# Patient Record
Sex: Male | Born: 1935 | Race: White | Hispanic: No | Smoking: Never smoker
Health system: Southern US, Community
[De-identification: ages and names within clinical notes are randomized; demographics above are authoritative.]

## PROBLEM LIST (undated history)

## (undated) DIAGNOSIS — U071 COVID-19: Secondary | ICD-10-CM

## (undated) DIAGNOSIS — F0280 Dementia in other diseases classified elsewhere without behavioral disturbance: Secondary | ICD-10-CM

## (undated) DIAGNOSIS — K219 Gastro-esophageal reflux disease without esophagitis: Secondary | ICD-10-CM

## (undated) DIAGNOSIS — E785 Hyperlipidemia, unspecified: Secondary | ICD-10-CM

## (undated) DIAGNOSIS — E871 Hypo-osmolality and hyponatremia: Secondary | ICD-10-CM

## (undated) DIAGNOSIS — I519 Heart disease, unspecified: Secondary | ICD-10-CM

## (undated) DIAGNOSIS — F419 Anxiety disorder, unspecified: Secondary | ICD-10-CM

## (undated) HISTORY — PX: CARDIAC SURGERY: SHX584

## (undated) HISTORY — PX: GANGLION CYST EXCISION: SHX1691

---

## 2016-07-21 ENCOUNTER — Emergency Department (INDEPENDENT_AMBULATORY_CARE_PROVIDER_SITE_OTHER): Payer: Medicare Other

## 2016-07-21 ENCOUNTER — Emergency Department
Admission: EM | Admit: 2016-07-21 | Discharge: 2016-07-21 | Disposition: A | Discharge status: Home / Self Care | Payer: Medicare Other | Source: Home / Self Care | Attending: Family Medicine | Admitting: Family Medicine

## 2016-07-21 ENCOUNTER — Encounter: Payer: Self-pay | Admitting: Emergency Medicine

## 2016-07-21 DIAGNOSIS — J069 Acute upper respiratory infection, unspecified: Secondary | ICD-10-CM | POA: Diagnosis not present

## 2016-07-21 DIAGNOSIS — B9789 Other viral agents as the cause of diseases classified elsewhere: Secondary | ICD-10-CM | POA: Diagnosis not present

## 2016-07-21 DIAGNOSIS — R062 Wheezing: Secondary | ICD-10-CM | POA: Diagnosis not present

## 2016-07-21 DIAGNOSIS — M545 Low back pain, unspecified: Secondary | ICD-10-CM

## 2016-07-21 DIAGNOSIS — R05 Cough: Secondary | ICD-10-CM | POA: Diagnosis not present

## 2016-07-21 DIAGNOSIS — Z20828 Contact with and (suspected) exposure to other viral communicable diseases: Secondary | ICD-10-CM

## 2016-07-21 LAB — POCT URINALYSIS DIP (MANUAL ENTRY)
Bilirubin, UA: NEGATIVE
Glucose, UA: NEGATIVE
Ketones, POC UA: NEGATIVE
Leukocytes, UA: NEGATIVE
Nitrite, UA: NEGATIVE
Protein Ur, POC: NEGATIVE
Spec Grav, UA: 1.01 (ref 1.005–1.03)
Urobilinogen, UA: 0.2 (ref 0–1)
pH, UA: 5.5 (ref 5–8)

## 2016-07-21 LAB — POCT INFLUENZA A/B
Influenza A, POC: NEGATIVE
Influenza B, POC: NEGATIVE

## 2016-07-21 MED ORDER — OSELTAMIVIR PHOSPHATE 75 MG PO CAPS: 75.0000 mg | ORAL_CAPSULE | Freq: Every day | ORAL | 0 refills | Status: DC

## 2016-07-21 MED ORDER — OSELTAMIVIR PHOSPHATE 75 MG PO CAPS: 75.0000 mg | ORAL_CAPSULE | Freq: Two times a day (BID) | ORAL | 0 refills | Status: DC

## 2016-07-21 NOTE — ED Provider Notes (Signed)
CSN: [id number]     Arrival date & time 07/21/16  1306 History   First MD Initiated Contact with Patient 07/21/16 1335     Chief Complaint  Patient presents with  . Cough   (Consider location/radiation/quality/duration/timing/severity/associated sxs/prior Treatment) HPI Allen Richardson is a 82 y.o. male presenting to UC with c/o 4 days of cough, congestion, mild body aches and fatigue. He did have the flu vaccine this year but notes his daughter, who he lives with, has had the flu recently.  Denies known fever. Denies n/v/d. He also reports having Right lower back pain and increased straining with urination. Denies hx of kidney stones.    History reviewed. No pertinent past medical history. History reviewed. No pertinent surgical history. No family history on file. Social History  Substance Use Topics  . Smoking status: Never Smoker  . Smokeless tobacco: Never Used  . Alcohol use No    Review of Systems  Constitutional: Positive for fatigue. Negative for chills and fever.  HENT: Positive for congestion and rhinorrhea. Negative for ear pain, sore throat, trouble swallowing and voice change.   Respiratory: Positive for cough. Negative for shortness of breath.   Cardiovascular: Negative for chest pain and palpitations.  Gastrointestinal: Negative for abdominal pain, diarrhea, nausea and vomiting.  Genitourinary: Positive for decreased urine volume, difficulty urinating and dysuria. Negative for frequency, hematuria and urgency.  Musculoskeletal: Positive for back pain ( Right lower). Negative for arthralgias and myalgias.  Skin: Negative for rash.    Allergies  Patient has no known allergies.  Home Medications   Prior to Admission medications   Medication Sig Start Date End Date Taking? Authorizing Provider  simvastatin (ZOCOR) 10 MG tablet Take 10 mg by mouth daily.   Yes Historical Provider, MD  tamsulosin (FLOMAX) 0.4 MG CAPS capsule Take 0.4 mg by mouth.   Yes Historical  Provider, MD  oseltamivir (TAMIFLU) 75 MG capsule Take 1 capsule (75 mg total) by mouth daily. For 10 days 07/21/16   Noland Fordyce, PA-C   Meds Ordered and Administered this Visit  Medications - No data to display  BP 148/74 (BP Location: Left Arm)   Pulse 70   Temp 98.4 F (36.9 C) (Oral)   Ht 5\' 9"  (1.753 m)   Wt 186 lb (84.4 kg)   SpO2 96%   BMI 27.47 kg/m  No data found.   Physical Exam  Constitutional: He is oriented to person, place, and time. He appears well-developed and well-nourished. No distress.  HENT:  Head: Normocephalic and atraumatic.  Right Ear: Tympanic membrane normal.  Left Ear: Tympanic membrane normal.  Nose: Nose normal.  Mouth/Throat: Uvula is midline, oropharynx is clear and moist and mucous membranes are normal.  Eyes: EOM are normal.  Neck: Normal range of motion. Neck supple.  Cardiovascular: Normal rate and regular rhythm.   Murmur ( chronic per pt) heard. Pulmonary/Chest: Effort normal. No stridor. No respiratory distress. He has wheezes ( upper lung fields). He has rales ( lower lung fields bilaterally ).  Musculoskeletal: Normal range of motion.  Lymphadenopathy:    He has no cervical adenopathy.  Neurological: He is alert and oriented to person, place, and time.  Skin: Skin is warm and dry. He is not diaphoretic.  Psychiatric: He has a normal mood and affect. His behavior is normal.  Nursing note and vitals reviewed.   Urgent Care Course     Procedures (including critical care time)  Labs Review Labs Reviewed  POCT URINALYSIS DIP (  MANUAL ENTRY) - Abnormal; Notable for the following:       Result Value   Blood, UA trace-intact (*)    All other components within normal limits  POCT INFLUENZA A/B    Imaging Review Dg Chest 2 View  Result Date: 07/21/2016 CLINICAL DATA:  Patient c/o cough, congestion, wheezing, just hasn't been feeling good since 07/17/16, no other complaints EXAM: CHEST  2 VIEW COMPARISON:  None FINDINGS: Midline  trachea. Prior median sternotomy. Normal heart size and mediastinal contours. No pleural effusion or pneumothorax. Left base scarring. IMPRESSION: No acute cardiopulmonary disease. Electronically Signed   By: Abigail Miyamoto M.D.   On: 07/21/2016 14:07      MDM   1. Viral URI with cough   2. Acute right-sided low back pain without sciatica   3. Exposure to the flu    Pt c/o URI symptoms for 4 days as well as Right lower back pain and straining with urination but notes he is on Flomax for this .  CXR: no evidence pneumonia. UA: unremarkable  Due to known exposure to his live-in daughter with flu, will start pt on prophylactic treatment  Rx: Tamiflu F/u with PCP next week for recheck of symptoms. Patient verbalized understanding and agreement with treatment plan.    Noland Fordyce, PA-C 07/21/16 1733

## 2016-07-21 NOTE — ED Triage Notes (Signed)
Cough, congestion x 4 days, daughter lives with him and has the flu.

## 2017-02-09 ENCOUNTER — Encounter: Payer: Self-pay | Admitting: *Deleted

## 2017-02-09 ENCOUNTER — Emergency Department
Admission: EM | Admit: 2017-02-09 | Discharge: 2017-02-09 | Disposition: A | Discharge status: Home / Self Care | Payer: Medicare Other | Source: Home / Self Care | Attending: Family Medicine | Admitting: Family Medicine

## 2017-02-09 DIAGNOSIS — L739 Follicular disorder, unspecified: Secondary | ICD-10-CM

## 2017-02-09 HISTORY — DX: Hyperlipidemia, unspecified: E78.5

## 2017-02-09 MED ORDER — DOXYCYCLINE HYCLATE 100 MG PO CAPS: 100.0000 mg | ORAL_CAPSULE | Freq: Two times a day (BID) | ORAL | 0 refills | Status: DC

## 2017-02-09 MED ORDER — PREDNISONE 20 MG PO TABS: ORAL_TABLET | ORAL | 0 refills | Status: DC

## 2017-02-09 NOTE — ED Triage Notes (Signed)
Patient c/o pruritic rash "all over" x 2 weeks. Also c/o burning to both feet. Used cortisone cream and alcohol to sites without relief.

## 2017-02-09 NOTE — Discharge Instructions (Signed)
May take Benadryl as needed for itching.

## 2017-02-09 NOTE — ED Provider Notes (Signed)
[facility]    CSN: [id number] Arrival date & time: 02/09/17  0854     History   Chief Complaint Chief Complaint  Patient presents with  . Rash    HPI Allen Richardson is a 82 y.o. male.   Patient complains of pruritic rash primarily on upper anterior legs, and some lesions on arms, lower legs, and trunk that started one week ago while he was working in his garden.  No involvement of face or scalp.  No lesions in mouth.  No fevers, chills, and sweats.  He feels well otherwise.   The history is provided by the patient.  Rash  Location:  Full body Quality: dryness, itchiness and redness   Quality: not blistering, not bruising, not burning, not draining, not painful, not peeling, not scaling, not swelling and not weeping   Severity:  Moderate Onset quality:  Sudden Duration:  1 week Timing:  Constant Progression:  Unchanged Chronicity:  New Context: not animal contact, not chemical exposure, not exposure to similar rash, not food, not hot tub use, not insect bite/sting, not medications, not new detergent/soap, not nuts, not plant contact, not pollen, not sick contacts and not sun exposure   Relieved by:  Nothing Worsened by:  Nothing Ineffective treatments:  Antihistamines and topical steroids Associated symptoms: no abdominal pain, no diarrhea, no fatigue, no fever, no headaches, no hoarse voice, no induration, no joint pain, no myalgias, no nausea, no shortness of breath, no sore throat, no throat swelling, no tongue swelling, no URI, not vomiting and not wheezing     Past Medical History:  Diagnosis Date  . Hyperlipidemia     There are no active problems to display for this patient.   History reviewed. No pertinent surgical history.     Home Medications    Prior to Admission medications   Medication Sig Start Date End Date Taking? Authorizing Provider  doxycycline (VIBRAMYCIN) 100 MG capsule Take 1 capsule (100 mg total) by mouth 2 (two) times daily.  Take with food. 02/09/17   Kandra Nicolas, MD  predniSONE (DELTASONE) 20 MG tablet Take one tab by mouth twice daily for 4 days, then one daily for 3 days. Take with food. 02/09/17   Kandra Nicolas, MD  simvastatin (ZOCOR) 10 MG tablet Take 10 mg by mouth daily.    [provider]  tamsulosin (FLOMAX) 0.4 MG CAPS capsule Take 0.4 mg by mouth.    [provider]    Family History History reviewed. No pertinent family history.  Social History Social History  Substance Use Topics  . Smoking status: Never Smoker  . Smokeless tobacco: Never Used  . Alcohol use No     Allergies   Penicillins   Review of Systems Review of Systems  Constitutional: Negative for fatigue and fever.  HENT: Negative for hoarse voice and sore throat.   Respiratory: Negative for shortness of breath and wheezing.   Gastrointestinal: Negative for abdominal pain, diarrhea, nausea and vomiting.  Musculoskeletal: Negative for arthralgias and myalgias.  Skin: Positive for rash.  Neurological: Negative for headaches.  All other systems reviewed and are negative.    Physical Exam Triage Vital Signs ED Triage Vitals  Enc Vitals Group     BP 02/09/17 0931 (!) 154/99     Pulse Rate 02/09/17 0931 70     Resp --      Temp 02/09/17 0931 97.7 F (36.5 C)     Temp Source 02/09/17 0931 Oral  SpO2 02/09/17 0931 96 %     Weight 02/09/17 0932 186 lb (84.4 kg)     Height --      Head Circumference --      Peak Flow --      Pain Score 02/09/17 0932 0     Pain Loc --      Pain Edu? --      Excl. in [place]? --    No data found.   Updated Vital Signs BP (!) 154/99 (BP Location: Left Arm)   Pulse 70   Temp 97.7 F (36.5 C) (Oral)   Wt 186 lb (84.4 kg)   SpO2 96%   BMI 27.47 kg/m   Visual Acuity Right Eye Distance:   Left Eye Distance:   Bilateral Distance:    Right Eye Near:   Left Eye Near:    Bilateral Near:     Physical Exam  Constitutional: He appears well-developed and  well-nourished. No distress.  HENT:  Head: Normocephalic.  Right Ear: External ear normal.  Left Ear: External ear normal.  Nose: Nose normal.  Mouth/Throat: Oropharynx is clear and moist.  Eyes: Pupils are equal, round, and reactive to light. Conjunctivae are normal.  Neck: Neck supple.  Cardiovascular: Normal heart sounds.   Pulmonary/Chest: Breath sounds normal.  Abdominal: There is no tenderness.  Musculoskeletal: He exhibits no edema.  Lymphadenopathy:    He has no cervical adenopathy.  Neurological: He is alert.  Skin: Skin is warm and dry.     Scattered tiny 21mm vesicles on erythematous base 37mm to 40mm diameter.  Lesions primarily concentrated on left anterior thigh.  No lesions on palms or plantar surfaces of feet.  Nursing note and vitals reviewed.    UC Treatments / Results  Labs (all labs ordered are listed, but only abnormal results are displayed) Labs Reviewed - No data to display  EKG  EKG Interpretation None       Radiology No results found.  Procedures Procedures (including critical care time)  Medications Ordered in UC Medications - No data to display   Initial Impression / Assessment and Plan / UC Course  I have reviewed the triage vital signs and the nursing notes.  Pertinent labs & imaging results that were available during my care of the patient were reviewed by me and considered in my medical decision making (see chart for details).    Begin doxycycline 100mg  BID for staph coverage, and prednisone burst/taper. May take Benadryl as needed for itching. Followup with dermatologist if not improved one week.    Final Clinical Impressions(s) / UC Diagnoses   Final diagnoses:  Folliculitis    New Prescriptions New Prescriptions   DOXYCYCLINE (VIBRAMYCIN) 100 MG CAPSULE    Take 1 capsule (100 mg total) by mouth 2 (two) times daily. Take with food.   PREDNISONE (DELTASONE) 20 MG TABLET    Take one tab by mouth twice daily for 4 days, then  one daily for 3 days. Take with food.        Kandra Nicolas, MD 02/09/17 1040

## 2017-03-02 ENCOUNTER — Other Ambulatory Visit: Payer: Self-pay | Admitting: Family Medicine

## 2017-03-02 ENCOUNTER — Emergency Department
Admission: EM | Admit: 2017-03-02 | Discharge: 2017-03-02 | Disposition: A | Discharge status: Home / Self Care | Payer: Medicare Other | Source: Home / Self Care | Attending: Family Medicine | Admitting: Family Medicine

## 2017-03-02 ENCOUNTER — Encounter: Payer: Self-pay | Admitting: Emergency Medicine

## 2017-03-02 DIAGNOSIS — R358 Other polyuria: Secondary | ICD-10-CM

## 2017-03-02 DIAGNOSIS — J029 Acute pharyngitis, unspecified: Secondary | ICD-10-CM

## 2017-03-02 DIAGNOSIS — R3589 Other polyuria: Secondary | ICD-10-CM

## 2017-03-02 LAB — POCT URINALYSIS DIP (MANUAL ENTRY)
BILIRUBIN UA: NEGATIVE
Blood, UA: NEGATIVE
Glucose, UA: NEGATIVE mg/dL
Ketones, POC UA: NEGATIVE mg/dL
LEUKOCYTES UA: NEGATIVE
NITRITE UA: NEGATIVE
PH UA: 6.5 (ref 5.0–8.0)
Protein Ur, POC: NEGATIVE mg/dL
Spec Grav, UA: 1.015 (ref 1.010–1.025)
Urobilinogen, UA: 1 E.U./dL

## 2017-03-02 LAB — POCT RAPID STREP A (OFFICE): Rapid Strep A Screen: NEGATIVE

## 2017-03-02 NOTE — ED Provider Notes (Signed)
[facility]    CSN: [id number] Arrival date & time: 03/02/17  1042     History   Chief Complaint Chief Complaint  Patient presents with  . Sore Throat  . Polyuria    HPI Allen Richardson is a 82 y.o. male.   HPI  Allen Richardson is a 82 y.o. male presenting to UC with c/o 3 days of mild sore throat and 2 days of bilateral lower back pain and polyuria.  He has had minimal congestion and chronic mild intermittent non-productive cough "all summer."  Denies fever, chills, n/v/d. No known sick contacts. Denies trying anything for his symptoms as he states the pain is minimal.  He has been on Flomax for about 20 years. He has routine f/u with his PCP next week.   Past Medical History:  Diagnosis Date  . Hyperlipidemia     There are no active problems to display for this patient.   History reviewed. No pertinent surgical history.     Home Medications    Prior to Admission medications   Medication Sig Start Date End Date Taking? Authorizing Provider  simvastatin (ZOCOR) 10 MG tablet Take 10 mg by mouth daily.    [provider]  tamsulosin (FLOMAX) 0.4 MG CAPS capsule Take 0.4 mg by mouth.    [provider]    Family History No family history on file.  Social History Social History  Substance Use Topics  . Smoking status: Never Smoker  . Smokeless tobacco: Never Used  . Alcohol use No     Allergies   Penicillins   Review of Systems Review of Systems  Constitutional: Negative for chills and fever.  HENT: Positive for sore throat. Negative for congestion, ear pain, trouble swallowing and voice change.   Respiratory: Negative for cough and shortness of breath.   Cardiovascular: Negative for chest pain and palpitations.  Gastrointestinal: Negative for abdominal pain, diarrhea, nausea and vomiting.  Endocrine: Positive for polyuria.  Genitourinary: Positive for frequency. Negative for dysuria, hematuria and urgency.    Musculoskeletal: Positive for back pain. Negative for arthralgias and myalgias.  Skin: Negative for rash.  Neurological: Negative for dizziness, light-headedness and headaches.     Physical Exam Triage Vital Signs ED Triage Vitals  Enc Vitals Group     BP 03/02/17 1112 (!) 151/80     Pulse Rate 03/02/17 1112 64     Resp --      Temp 03/02/17 1112 97.6 F (36.4 C)     Temp Source 03/02/17 1112 Oral     SpO2 03/02/17 1112 98 %     Weight 03/02/17 1113 182 lb (82.6 kg)     Height 03/02/17 1113 5\' 9"  (1.753 m)     Head Circumference --      Peak Flow --      Pain Score 03/02/17 1113 5     Pain Loc --      Pain Edu? --      Excl. in [place]? --    No data found.   Updated Vital Signs BP (!) 151/80 (BP Location: Left Arm)   Pulse 64   Temp 97.6 F (36.4 C) (Oral)   Ht 5\' 9"  (1.753 m)   Wt 182 lb (82.6 kg)   SpO2 98%   BMI 26.88 kg/m      Physical Exam  Constitutional: He is oriented to person, place, and time. He appears well-developed and well-nourished. No distress.  HENT:  Head: Normocephalic and  atraumatic.  Right Ear: Tympanic membrane normal.  Left Ear: Tympanic membrane normal.  Nose: Nose normal.  Mouth/Throat: Uvula is midline and mucous membranes are normal. Posterior oropharyngeal erythema present. No oropharyngeal exudate, posterior oropharyngeal edema or tonsillar abscesses.  Eyes: EOM are normal.  Neck: Normal range of motion. Neck supple.  Cardiovascular: Normal rate and regular rhythm.   Pulmonary/Chest: Effort normal and breath sounds normal. No stridor. No respiratory distress. He has no wheezes. He has no rales.  Musculoskeletal: Normal range of motion.  Lymphadenopathy:    He has no cervical adenopathy.  Neurological: He is alert and oriented to person, place, and time.  Skin: Skin is warm and dry. He is not diaphoretic.  Psychiatric: He has a normal mood and affect. His behavior is normal.  Nursing note and vitals reviewed.    UC Treatments /  Results  Labs (all labs ordered are listed, but only abnormal results are displayed) Labs Reviewed  STREP A DNA PROBE  POCT RAPID STREP A (OFFICE)  POCT URINALYSIS DIP (MANUAL ENTRY)    EKG  EKG Interpretation None       Radiology No results found.  Procedures Procedures (including critical care time)  Medications Ordered in UC Medications - No data to display   Initial Impression / Assessment and Plan / UC Course  I have reviewed the triage vital signs and the nursing notes.  Pertinent labs & imaging results that were available during my care of the patient were reviewed by me and considered in my medical decision making (see chart for details).    Rapid strep: Negative Culture sent UA: Normal Reassured pt, symptoms likely viral in nature.  Encouraged fluids, rest, acetaminophen, and saltwater gargles F/u with PCP next week as previously scheduled.   Final Clinical Impressions(s) / UC Diagnoses   Final diagnoses:  Sore throat  Polyuria    New Prescriptions New Prescriptions   No medications on file     Controlled Substance Prescriptions [place] Controlled Substance Registry consulted? Not Applicable   Tyrell Antonio 03/02/17 1134

## 2017-03-02 NOTE — ED Triage Notes (Signed)
Sore throat x 3 days Polyuria, LBP x 2 days

## 2017-03-03 LAB — STREP A DNA PROBE: GROUP A STREP PROBE: NOT DETECTED

## 2017-03-04 ENCOUNTER — Telehealth: Payer: Self-pay | Admitting: Emergency Medicine

## 2017-03-04 NOTE — Telephone Encounter (Signed)
Lm to patient- Neg. Throat CX

## 2017-11-29 ENCOUNTER — Encounter: Payer: Self-pay | Admitting: Emergency Medicine

## 2017-11-29 ENCOUNTER — Other Ambulatory Visit: Payer: Self-pay

## 2017-11-29 ENCOUNTER — Emergency Department (INDEPENDENT_AMBULATORY_CARE_PROVIDER_SITE_OTHER)
Admission: EM | Admit: 2017-11-29 | Discharge: 2017-11-29 | Disposition: A | Discharge status: Home / Self Care | Payer: Medicare Other | Source: Home / Self Care | Attending: Family Medicine | Admitting: Family Medicine

## 2017-11-29 DIAGNOSIS — W57XXXA Bitten or stung by nonvenomous insect and other nonvenomous arthropods, initial encounter: Secondary | ICD-10-CM

## 2017-11-29 DIAGNOSIS — R2241 Localized swelling, mass and lump, right lower limb: Secondary | ICD-10-CM

## 2017-11-29 DIAGNOSIS — S80861A Insect bite (nonvenomous), right lower leg, initial encounter: Secondary | ICD-10-CM

## 2017-11-29 MED ORDER — MUPIROCIN 2 % EX OINT: 1.0000 "application " | TOPICAL_OINTMENT | Freq: Three times a day (TID) | CUTANEOUS | 0 refills | Status: DC

## 2017-11-29 NOTE — ED Notes (Signed)
Antibiotic ointment and bandaids applied per Dr Assunta Found

## 2017-11-29 NOTE — ED Provider Notes (Signed)
[facility]    CSN: [id number] Arrival date & time: 11/29/17  0936     History   Chief Complaint Chief Complaint  Patient presents with  . Leg Pain    R leg    HPI Allen Richardson is a 83 y.o. male.   Patient noticed a small sore spot on the posterior aspect of his right knee, and believes he may have had an insect bite.  The lesion has persisted, but he is unable to visualize it because of its location. About two weeks ago he noticed a bump on the medial aspect of his left great toe.  The lesion itches and drains slightly.  He feels well otherwise.  The history is provided by the patient.  Rash  Location: right posterior knee, and right great toe. Quality: dryness, itchiness, redness, scaling and weeping   Quality: not blistering, not bruising, not burning, not draining, not painful, not peeling and not swelling   Severity:  Mild Onset quality:  Sudden Duration:  2 weeks Timing:  Constant Progression:  Unchanged Chronicity:  New Context: insect bite/sting   Context: not animal contact, not chemical exposure, not exposure to similar rash, not food, not hot tub use, not medications, not new detergent/soap, not nuts, not plant contact, not pollen, not sick contacts and not sun exposure   Relieved by:  Nothing Worsened by:  Nothing Ineffective treatments:  None tried Associated symptoms: no abdominal pain, no diarrhea, no fatigue, no fever, no headaches, no hoarse voice, no induration, no joint pain, no myalgias and no nausea     Past Medical History:  Diagnosis Date  . Hyperlipidemia     There are no active problems to display for this patient.   History reviewed. No pertinent surgical history.     Home Medications    Prior to Admission medications   Medication Sig Start Date End Date Taking? Authorizing Provider  mupirocin ointment (BACTROBAN) 2 % Apply 1 application topically 3 (three) times daily. 11/29/17   Kandra Nicolas, MD  simvastatin  (ZOCOR) 10 MG tablet Take 10 mg by mouth daily.    [provider]  tamsulosin (FLOMAX) 0.4 MG CAPS capsule Take 0.4 mg by mouth.    [provider]    Family History History reviewed. No pertinent family history.  Social History Social History   Tobacco Use  . Smoking status: Never Smoker  . Smokeless tobacco: Never Used  Substance Use Topics  . Alcohol use: No  . Drug use: No     Allergies   Penicillins   Review of Systems Review of Systems  Constitutional: Negative for fatigue and fever.  HENT: Negative for hoarse voice.   Gastrointestinal: Negative for abdominal pain, diarrhea and nausea.  Musculoskeletal: Negative for arthralgias and myalgias.  Skin: Positive for rash.  Neurological: Negative for headaches.  All other systems reviewed and are negative.    Physical Exam Triage Vital Signs ED Triage Vitals [11/29/17 1001]  Enc Vitals Group     BP (!) 149/82     Pulse Rate 61     Resp      Temp 97.6 F (36.4 C)     Temp Source Oral     SpO2 99 %     Weight 184 lb (83.5 kg)     Height 5\' 9"  (1.753 m)     Head Circumference      Peak Flow      Pain Score 4  Pain Loc      Pain Edu?      Excl. in [place]?    No data found.  Updated Vital Signs BP (!) 149/82 (BP Location: Right Arm)   Pulse 61   Temp 97.6 F (36.4 C) (Oral)   Ht 5\' 9"  (1.753 m)   Wt 184 lb (83.5 kg)   SpO2 99%   BMI 27.17 kg/m   Visual Acuity Right Eye Distance:   Left Eye Distance:   Bilateral Distance:    Right Eye Near:   Left Eye Near:    Bilateral Near:     Physical Exam  Constitutional: He appears well-developed and well-nourished. No distress.  HENT:  Head: Atraumatic.  Eyes: Pupils are equal, round, and reactive to light.  Cardiovascular: Normal rate.  Pulmonary/Chest: Effort normal.  Musculoskeletal: He exhibits no edema.       Legs:      Feet:  There is 38mm diameter slightly raised nodule medial aspect right great toe MTP joint.  Lesion has  excoriated surface and surrounding erythema.  Not fluctuant or indurated.  Right posterior knee has 80mm diameter slightly raised erythematous macule, suggestive of infected insect bite.    Neurological: He is alert.  Skin: Skin is dry.  Nursing note and vitals reviewed.    UC Treatments / Results  Labs (all labs ordered are listed, but only abnormal results are displayed) Labs Reviewed - No data to display  EKG None  Radiology No results found.  Procedures Procedures (including critical care time)  Medications Ordered in UC Medications - No data to display  Initial Impression / Assessment and Plan / UC Course  I have reviewed the triage vital signs and the nursing notes.  Pertinent labs & imaging results that were available during my care of the patient were reviewed by me and considered in my medical decision making (see chart for details).    Applied Bacitracin ointment and bandaid to each lesion. Small lesion right posterior knee probably infected insect bite. Nodule right medial great toe may be infected wart.  Begin Bactroban ointment TID. Recommend follow-up with dermatologist if not resolved about 10 days.  Lesion right great toe may need biopsy if not resolved.   Final Clinical Impressions(s) / UC Diagnoses   Final diagnoses:  Skin nodule of toe of right foot  Insect bite of right lower extremity, initial encounter     Discharge Instructions     Apply bandage with antibiotic ointment.   ED Prescriptions    Medication Sig Dispense Auth. Provider   mupirocin ointment (BACTROBAN) 2 % Apply 1 application topically 3 (three) times daily. 22 g Kandra Nicolas, MD         Kandra Nicolas, MD 11/29/17 1210

## 2017-11-29 NOTE — Discharge Instructions (Addendum)
Apply bandage with antibiotic ointment.

## 2017-11-29 NOTE — ED Triage Notes (Signed)
83 y.o male presents today c/o right leg and foot pain. The medical aspect of the foot below the hallux and the leg under the knee. He states he was working out in the woods and noticed the spots shortly after. He denies drainage from either area.

## 2019-12-02 ENCOUNTER — Emergency Department (INDEPENDENT_AMBULATORY_CARE_PROVIDER_SITE_OTHER): Payer: Medicare Other

## 2019-12-02 ENCOUNTER — Emergency Department
Admission: EM | Admit: 2019-12-02 | Discharge: 2019-12-02 | Disposition: A | Discharge status: Home / Self Care | Payer: Medicare Other | Source: Home / Self Care

## 2019-12-02 DIAGNOSIS — D649 Anemia, unspecified: Secondary | ICD-10-CM

## 2019-12-02 DIAGNOSIS — R350 Frequency of micturition: Secondary | ICD-10-CM | POA: Diagnosis not present

## 2019-12-02 DIAGNOSIS — R309 Painful micturition, unspecified: Secondary | ICD-10-CM

## 2019-12-02 DIAGNOSIS — R109 Unspecified abdominal pain: Secondary | ICD-10-CM

## 2019-12-02 DIAGNOSIS — K5900 Constipation, unspecified: Secondary | ICD-10-CM

## 2019-12-02 DIAGNOSIS — Z8719 Personal history of other diseases of the digestive system: Secondary | ICD-10-CM | POA: Diagnosis not present

## 2019-12-02 LAB — POCT URINALYSIS DIP (MANUAL ENTRY)
Bilirubin, UA: NEGATIVE
Blood, UA: NEGATIVE
Glucose, UA: NEGATIVE mg/dL
Ketones, POC UA: NEGATIVE mg/dL
Leukocytes, UA: NEGATIVE
Nitrite, UA: NEGATIVE
Protein Ur, POC: NEGATIVE mg/dL
Spec Grav, UA: 1.01 (ref 1.010–1.025)
Urobilinogen, UA: 0.2 E.U./dL
pH, UA: 6 (ref 5.0–8.0)

## 2019-12-02 LAB — POCT CBC W AUTO DIFF (K'VILLE URGENT CARE)

## 2019-12-02 NOTE — ED Provider Notes (Signed)
[facility]    CSN: [id number] Arrival date & time: 12/02/19  1207      History   Chief Complaint Chief Complaint  Patient presents with  . Flank Pain    HPI Allen Richardson is a 85 y.o. male.   HPI  Allen Richardson is a 85 y.o. male presenting to UC with c/o Right flank pain that started last night, pain is aching, occasionally sharp, 7/10.  He also reports urinary frequency and pain. He does take Flomax due to BPH.  Denies blood in his urine. Denies fever, chills, n/v/d. No hx of kidney stones. He has had issues with constipation and currently changing medications around to help with BMs. Today he had a small to medium size hard BM.   Past Medical History:  Diagnosis Date  . Hyperlipidemia     There are no problems to display for this patient.   History reviewed. No pertinent surgical history.     Home Medications    Prior to Admission medications   Medication Sig Start Date End Date Taking? Authorizing Provider  simvastatin (ZOCOR) 10 MG tablet Take 10 mg by mouth daily.   Yes [provider]  tamsulosin (FLOMAX) 0.4 MG CAPS capsule Take 0.4 mg by mouth.   Yes [provider]  mupirocin ointment (BACTROBAN) 2 % Apply 1 application topically 3 (three) times daily. 11/29/17   Kandra Nicolas, MD    Family History Family History  Problem Relation Age of Onset  . Osteoarthritis Mother   . Cancer Father     Social History Social History   Tobacco Use  . Smoking status: Never Smoker  . Smokeless tobacco: Never Used  Vaping Use  . Vaping Use: Never used  Substance Use Topics  . Alcohol use: No  . Drug use: No     Allergies   Penicillins   Review of Systems Review of Systems  Constitutional: Negative for chills and fever.  HENT: Negative for congestion, ear pain, sore throat, trouble swallowing and voice change.   Respiratory: Negative for cough and shortness of breath.   Cardiovascular: Negative for chest pain and  palpitations.  Gastrointestinal: Positive for abdominal pain and constipation. Negative for abdominal distention, blood in stool, diarrhea, nausea and vomiting.  Genitourinary: Positive for dysuria, flank pain and frequency. Negative for discharge, hematuria, penile pain and testicular pain.  Musculoskeletal: Negative for arthralgias, back pain and myalgias.  Skin: Negative for rash.  All other systems reviewed and are negative.    Physical Exam Triage Vital Signs ED Triage Vitals  Enc Vitals Group     BP 12/02/19 1231 133/73     Pulse Rate 12/02/19 1231 83     Resp 12/02/19 1231 16     Temp 12/02/19 1231 98.7 F (37.1 C)     Temp Source 12/02/19 1231 Oral     SpO2 12/02/19 1231 99 %     Weight --      Height --      Head Circumference --      Peak Flow --      Pain Score 12/02/19 1229 7     Pain Loc --      Pain Edu? --      Excl. in [place]? --    No data found.  Updated Vital Signs BP 133/73 (BP Location: Right Arm)   Pulse 83   Temp 98.7 F (37.1 C) (Oral)   Resp 16   SpO2 99%  Visual Acuity Right Eye Distance:   Left Eye Distance:   Bilateral Distance:    Right Eye Near:   Left Eye Near:    Bilateral Near:     Physical Exam Vitals and nursing note reviewed.  Constitutional:      Appearance: Normal appearance. He is well-developed.  HENT:     Head: Normocephalic and atraumatic.     Mouth/Throat:     Mouth: Mucous membranes are moist.     Pharynx: Oropharynx is clear.  Cardiovascular:     Rate and Rhythm: Normal rate and regular rhythm.  Pulmonary:     Effort: Pulmonary effort is normal. No respiratory distress.     Breath sounds: Normal breath sounds.  Abdominal:     General: Abdomen is flat. There is no distension.     Palpations: Abdomen is soft. There is no mass.     Tenderness: There is abdominal tenderness (mild Right). There is right CVA tenderness ( mild). There is no left CVA tenderness, guarding or rebound.     Hernia: No hernia is present.   Musculoskeletal:        General: Normal range of motion.     Cervical back: Normal range of motion.  Skin:    General: Skin is warm and dry.  Neurological:     Mental Status: He is alert and oriented to person, place, and time.  Psychiatric:        Behavior: Behavior normal.      UC Treatments / Results  Labs (all labs ordered are listed, but only abnormal results are displayed) Labs Reviewed  COMPLETE METABOLIC PANEL WITH GFR  POCT URINALYSIS DIP (MANUAL ENTRY)  POCT CBC W AUTO DIFF ([facility])    EKG   Radiology CT Renal Stone Study  Result Date: 12/02/2019 CLINICAL DATA:  Flank pain.  Frequent and painful urination. EXAM: CT ABDOMEN AND PELVIS WITHOUT CONTRAST TECHNIQUE: Multidetector CT imaging of the abdomen and pelvis was performed following the standard protocol without IV contrast. COMPARISON:  None. FINDINGS: Lower chest: There are 2 nodules in the right middle lobe measuring 5 x 6 and 4 x 4 mm on images 6 and 8 of series 4. There are multiple tiny nodules in the left lower lobe, 2 mm, images 2, 3 and 5 of series 4. Aortic atherosclerosis. CABG. Hepatobiliary: Multiple gallstones. No bile duct dilatation. Gallbladder is not distended. Tiny calcified granulomas in the liver. Liver parenchyma is otherwise normal. Pancreas: Unremarkable. No pancreatic ductal dilatation or surrounding inflammatory changes. Spleen: Normal in size without focal abnormality. Adrenals/Urinary Tract: Normal adrenal glands. No renal or ureteral bladder calculi. No hydronephrosis. 16 mm low-density lesion in the mid left kidney. 20 mm low-density lesion in the lower pole of the left kidney. 9 mm exophytic low-density lesion in the mid right kidney. 19 mm low-density lesion in the upper pole of the right kidney. Enlarged prostate gland. Stomach/Bowel: Stomach is within normal limits. Appendix is not visualized. Haziness at the root of the mesentery, specific. No evidence of bowel wall thickening,  distention, or inflammatory changes. Vascular/Lymphatic: Extensive aortic atherosclerosis. No myelopathy. Reproductive: Enlarged prostate gland. Other: No abdominal wall hernia or abnormality. No abdominopelvic ascites. Musculoskeletal: No acute abnormalities. Degenerative disc disease in the lower lumbar spine. Severe arthritis of the left hip. IMPRESSION: 1. No acute abnormality of the abdomen or pelvis. 2. Cholelithiasis. 3. Multiple low-density lesions in both kidneys, likely cysts. Ultrasound of the kidneys recommended for further characterization. 4. Extensive aortic atherosclerosis. 5.  Enlarged prostate gland. 6. Multiple small nodules in the lung bases. Non-contrast chest CT at 3-6 months is recommended. If the nodules are stable at time of repeat CT, then future CT at 18-24 months (from today's scan) is considered optional for low-risk patients, but is recommended for high-risk patients. This recommendation follows the consensus statement: Guidelines for Management of Incidental Pulmonary Nodules Detected on CT Images: From the Fleischner Society 2017; Radiology 2017; 284:228-243. Aortic Atherosclerosis (ICD10-I70.0). Electronically Signed   By: Lorriane Shire M.D.   On: 12/02/2019 13:35    Procedures Procedures (including critical care time)  Medications Ordered in UC Medications - No data to display  Initial Impression / Assessment and Plan / UC Course  I have reviewed the triage vital signs and the nursing notes.  Pertinent labs & imaging results that were available during my care of the patient were reviewed by me and considered in my medical decision making (see chart for details).    Discussed imaging with pt CBC: mild anemia CMP pending Encouraged f/u with PCP AVS provided  Final Clinical Impressions(s) / UC Diagnoses   Final diagnoses:  Right flank pain  Hx of cholelithiasis  Constipation, unspecified constipation type  Mild anemia     Discharge Instructions       You may try over the counter stool softeners and laxatives such as Colace and Miralax to help with your constipation. Stay well hydrated. Walking can also help move the bowels. Schedule a follow up appointment with your primary care provider this week for recheck of symptoms.  Call 911 or go to the hospital if pain worsening, develop fever, vomiting, unable to urinate, or other new concerning symptoms develop.     ED Prescriptions    None     PDMP not reviewed this encounter.   Noe Gens, [place] 12/04/19 1212

## 2019-12-02 NOTE — Discharge Instructions (Addendum)
  You may try over the counter stool softeners and laxatives such as Colace and Miralax to help with your constipation. Stay well hydrated. Walking can also help move the bowels. Schedule a follow up appointment with your primary care provider this week for recheck of symptoms.  Call 911 or go to the hospital if pain worsening, develop fever, vomiting, unable to urinate, or other new concerning symptoms develop.

## 2019-12-02 NOTE — ED Triage Notes (Signed)
Patient presents to Urgent Care with complaints of right flank pain since last night. Patient reports he has had frequent and painful urination as well. Has not taken anything for pain.

## 2019-12-03 LAB — COMPLETE METABOLIC PANEL WITH GFR
AG Ratio: 1.6 (calc) (ref 1.0–2.5)
ALT: 12 U/L (ref 9–46)
AST: 17 U/L (ref 10–35)
Albumin: 4.1 g/dL (ref 3.6–5.1)
Alkaline phosphatase (APISO): 48 U/L (ref 35–144)
BUN: 12 mg/dL (ref 7–25)
CO2: 23 mmol/L (ref 20–32)
Calcium: 9.4 mg/dL (ref 8.6–10.3)
Chloride: 105 mmol/L (ref 98–110)
Creat: 0.97 mg/dL (ref 0.70–1.11)
GFR, Est African American: 84 mL/min/{1.73_m2} (ref >=60)
GFR, Est Non African American: 72 mL/min/{1.73_m2} (ref >=60)
Globulin: 2.6 g/dL (calc) (ref 1.9–3.7)
Glucose, Bld: 99 mg/dL (ref 65–99)
Potassium: 4 mmol/L (ref 3.5–5.3)
Sodium: 137 mmol/L (ref 135–146)
Total Bilirubin: 0.5 mg/dL (ref 0.2–1.2)
Total Protein: 6.7 g/dL (ref 6.1–8.1)

## 2020-03-06 ENCOUNTER — Other Ambulatory Visit: Payer: Self-pay

## 2020-03-06 ENCOUNTER — Encounter: Payer: Self-pay | Admitting: Emergency Medicine

## 2020-03-06 ENCOUNTER — Emergency Department (INDEPENDENT_AMBULATORY_CARE_PROVIDER_SITE_OTHER): Payer: Medicare Other

## 2020-03-06 ENCOUNTER — Emergency Department
Admission: EM | Admit: 2020-03-06 | Discharge: 2020-03-06 | Disposition: A | Discharge status: Home / Self Care | Payer: Medicare Other | Source: Home / Self Care | Attending: Family Medicine | Admitting: Family Medicine

## 2020-03-06 DIAGNOSIS — M545 Low back pain: Secondary | ICD-10-CM | POA: Diagnosis not present

## 2020-03-06 DIAGNOSIS — R3589 Other polyuria: Secondary | ICD-10-CM

## 2020-03-06 DIAGNOSIS — R358 Other polyuria: Secondary | ICD-10-CM | POA: Diagnosis not present

## 2020-03-06 DIAGNOSIS — R42 Dizziness and giddiness: Secondary | ICD-10-CM

## 2020-03-06 DIAGNOSIS — N401 Enlarged prostate with lower urinary tract symptoms: Secondary | ICD-10-CM

## 2020-03-06 DIAGNOSIS — R3912 Poor urinary stream: Secondary | ICD-10-CM

## 2020-03-06 DIAGNOSIS — G8929 Other chronic pain: Secondary | ICD-10-CM

## 2020-03-06 LAB — POCT URINALYSIS DIP (MANUAL ENTRY)
Bilirubin, UA: NEGATIVE
Blood, UA: NEGATIVE
Glucose, UA: NEGATIVE mg/dL
Ketones, POC UA: NEGATIVE mg/dL
Leukocytes, UA: NEGATIVE
Nitrite, UA: NEGATIVE
Protein Ur, POC: NEGATIVE mg/dL
Spec Grav, UA: 1.015 (ref 1.010–1.025)
Urobilinogen, UA: 0.2 E.U./dL
pH, UA: 7 (ref 5.0–8.0)

## 2020-03-06 MED ORDER — SULFAMETHOXAZOLE-TRIMETHOPRIM 800-160 MG PO TABS: 1.0000 | ORAL_TABLET | Freq: Two times a day (BID) | ORAL | 0 refills | Status: DC

## 2020-03-06 NOTE — Discharge Instructions (Signed)
Increase fluid intake. May try taking daily Saw [place]metto for prostate symptoms.

## 2020-03-06 NOTE — ED Triage Notes (Signed)
Dizziness, Elevated B/P worse today Low back pain when he wakes up in the morning, hard to urinate in the morning

## 2020-03-06 NOTE — ED Provider Notes (Signed)
[facility]    CSN: [id number] Arrival date & time: 03/06/20  1216      History   Chief Complaint Chief Complaint  Patient presents with  . Dizziness    HPI Allen Richardson is a 85 y.o. male.   Patient presents with several complaints:  He has felt vaguely light-headed at times (not vertigo).  He has a history of lower back pain with sciatica.  During the past week he has had increased lower back ache upon awakening each morning with difficulty starting his urinary stream in the morning.  He states that his urinary flow has been decreased after sitting for about a month, but he denies fevers, chills, and sweats and dysuria.  He presently takes Flomax BID.  The history is provided by the patient.    Past Medical History:  Diagnosis Date  . Hyperlipidemia     There are no problems to display for this patient.   History reviewed. No pertinent surgical history.     Home Medications    Prior to Admission medications   Medication Sig Start Date End Date Taking? Authorizing Provider  ferrous sulfate 325 (65 FE) MG EC tablet Take 325 mg by mouth 3 (three) times daily with meals.   Yes [provider]  simvastatin (ZOCOR) 10 MG tablet Take 10 mg by mouth daily.    [provider]  tamsulosin (FLOMAX) 0.4 MG CAPS capsule Take 0.4 mg by mouth.    [provider]    Family History Family History  Problem Relation Age of Onset  . Osteoarthritis Mother   . Cancer Father     Social History Social History   Tobacco Use  . Smoking status: Never Smoker  . Smokeless tobacco: Never Used  Vaping Use  . Vaping Use: Never used  Substance Use Topics  . Alcohol use: No  . Drug use: No     Allergies   Penicillins   Review of Systems Review of Systems  Constitutional: Negative for activity change, appetite change, chills, diaphoresis, fatigue, fever and unexpected weight change.  HENT: Negative.   Eyes: Negative.   Respiratory:  Negative.   Cardiovascular: Negative.   Gastrointestinal: Negative for abdominal pain, blood in stool, nausea and vomiting.  Genitourinary: Positive for decreased urine volume and urgency. Negative for discharge, dysuria, flank pain, frequency, hematuria, penile pain, penile swelling, scrotal swelling and testicular pain.  Musculoskeletal: Positive for back pain.  Skin: Negative.   Neurological: Positive for light-headedness. Negative for dizziness, weakness and headaches.  Hematological: Negative.      Physical Exam Triage Vital Signs ED Triage Vitals  Enc Vitals Group     BP 03/06/20 1426 (!) 172/75     Pulse Rate 03/06/20 1426 60     Resp --      Temp 03/06/20 1426 97.8 F (36.6 C)     Temp Source 03/06/20 1426 Oral     SpO2 03/06/20 1426 97 %     Weight 03/06/20 1427 190 lb (86.2 kg)     Height 03/06/20 1427 5\' 9"  (1.753 m)     Head Circumference --      Peak Flow --      Pain Score 03/06/20 1426 0     Pain Loc --      Pain Edu? --      Excl. in [place]? --    Orthostatic VS for the past 24 hrs:  BP- Lying Pulse- Lying BP- Sitting Pulse- Sitting BP- Standing  at 0 minutes Pulse- Standing at 0 minutes  03/06/20 1611 178/74 50 177/73 56 179/76 58    Updated Vital Signs BP (!) 172/75 (BP Location: Right Arm)   Pulse 60   Temp 97.8 F (36.6 C) (Oral)   Ht 5\' 9"  (1.753 m)   Wt 86.2 kg   SpO2 97%   BMI 28.06 kg/m   Visual Acuity Right Eye Distance:   Left Eye Distance:   Bilateral Distance:    Right Eye Near:   Left Eye Near:    Bilateral Near:     Physical Exam Vitals and nursing note reviewed.  Constitutional:      General: He is not in acute distress.    Appearance: He is not ill-appearing.  HENT:     Head: Normocephalic.     Right Ear: Tympanic membrane and ear canal normal.     Left Ear: Tympanic membrane and ear canal normal.     Nose: Nose normal.     Mouth/Throat:     Mouth: Mucous membranes are moist.     Pharynx: Oropharynx is clear.      Comments: Dentures present Eyes:     Extraocular Movements: Extraocular movements intact.     Conjunctiva/sclera: Conjunctivae normal.     Pupils: Pupils are equal, round, and reactive to light.  Cardiovascular:     Rate and Rhythm: Normal rate and regular rhythm.     Heart sounds: Normal heart sounds.  Pulmonary:     Breath sounds: Normal breath sounds.  Abdominal:     Palpations: Abdomen is soft.     Tenderness: There is no abdominal tenderness.  Musculoskeletal:     Cervical back: Neck supple.     Lumbar back: No tenderness or bony tenderness.     Right lower leg: No edema.     Left lower leg: No edema.  Lymphadenopathy:     Cervical: No cervical adenopathy.  Skin:    General: Skin is warm and dry.     Findings: No rash.  Neurological:     Mental Status: He is alert and oriented to person, place, and time.      UC Treatments / Results  Labs (all labs ordered are listed, but only abnormal results are displayed) Labs Reviewed  URINE CULTURE  CBC WITH DIFFERENTIAL/PLATELET  EXTRA SPECIMEN  POCT URINALYSIS DIP (MANUAL ENTRY)    EKG   Radiology DG Lumbar Spine Complete  Result Date: 03/06/2020 CLINICAL DATA:  Chronic lower back pain we slightly increased over last month. No known injury. EXAM: LUMBAR SPINE - COMPLETE 4+ VIEW COMPARISON:  None. FINDINGS: Five non-rib-bearing lumbar vertebral bodies. Multilevel moderate degenerative changes of the spine including osteophyte formation and facet arthropathy. There is no evidence of lumbar spine fracture. No findings suggest pars interarticularis defects bilaterally. Alignment is normal. Multilevel intervertebral disc space narrowing worse at the L4-L5 and L5-S1 levels. Severe calcifications of the aorta. IMPRESSION: 1. No acute fracture or traumatic listhesis of the lumbar spine. 2. At least moderate degenerative changes of the lumbar spine. 3. Aortic Atherosclerosis (ICD10-I70.0). Electronically Signed   By: Iven Finn  M.D.   On: 03/06/2020 16:49    Procedures Procedures (including critical care time)  Medications Ordered in UC Medications - No data to display  Initial Impression / Assessment and Plan / UC Course  I have reviewed the triage vital signs and the nursing notes.  Pertinent labs & imaging results that were available during my care of the patient were  reviewed by me and considered in my medical decision making (see chart for details).     No acute changes seen on LS spine x-rays.  Suspect worsening BPH; ?prostatitis. Review of records reveals mild anemia on CBC done 12/02/19 (Hgb 12.2) Urine culture and CBC pending. Begin empiric Bactrim DS. Followup with Family Doctor if not improved in about 2 weeks.   Final Clinical Impressions(s) / UC Diagnoses   Final diagnoses:  Polyuria  Lightheadedness  Chronic bilateral low back pain with bilateral sciatica   Discharge Instructions   None    ED Prescriptions    None        Kandra Nicolas, MD 03/07/20 1453

## 2020-03-07 LAB — CBC WITH DIFFERENTIAL/PLATELET
Absolute Monocytes: 605 cells/uL (ref 200–950)
Basophils Absolute: 27 cells/uL (ref 0–200)
Basophils Relative: 0.4 %
Eosinophils Absolute: 82 cells/uL (ref 15–500)
Eosinophils Relative: 1.2 %
HCT: 42.3 % (ref 38.5–50.0)
Hemoglobin: 14.3 g/dL (ref 13.2–17.1)
Lymphs Abs: 1986 cells/uL (ref 850–3900)
MCH: 31.2 pg (ref 27.0–33.0)
MCHC: 33.8 g/dL (ref 32.0–36.0)
MCV: 92.2 fL (ref 80.0–100.0)
MPV: 9.9 fL (ref 7.5–12.5)
Monocytes Relative: 8.9 %
Neutro Abs: 4100 cells/uL (ref 1500–7800)
Neutrophils Relative %: 60.3 %
Platelets: 174 10*3/uL (ref 140–400)
RBC: 4.59 10*6/uL (ref 4.20–5.80)
RDW: 12.4 % (ref 11.0–15.0)
Total Lymphocyte: 29.2 %
WBC: 6.8 10*3/uL (ref 3.8–10.8)

## 2020-03-07 LAB — EXTRA SPECIMEN

## 2020-03-08 LAB — URINE CULTURE
MICRO NUMBER:: 10967058
Result:: NO GROWTH
SPECIMEN QUALITY:: ADEQUATE

## 2020-03-10 ENCOUNTER — Telehealth: Payer: Self-pay

## 2020-03-10 NOTE — Telephone Encounter (Signed)
Pt called for lab results. Informed results are neg. Pt is feeling better since UC visit. Advised to f/u with PCP if sxs return or worsen

## 2020-07-11 ENCOUNTER — Emergency Department (INDEPENDENT_AMBULATORY_CARE_PROVIDER_SITE_OTHER): Payer: Medicare Other

## 2020-07-11 ENCOUNTER — Emergency Department
Admission: EM | Admit: 2020-07-11 | Discharge: 2020-07-11 | Disposition: A | Discharge status: Home / Self Care | Payer: Medicare Other | Source: Home / Self Care

## 2020-07-11 ENCOUNTER — Encounter: Payer: Self-pay | Admitting: Emergency Medicine

## 2020-07-11 ENCOUNTER — Other Ambulatory Visit: Payer: Self-pay

## 2020-07-11 DIAGNOSIS — R0781 Pleurodynia: Secondary | ICD-10-CM | POA: Diagnosis not present

## 2020-07-11 DIAGNOSIS — U071 COVID-19: Secondary | ICD-10-CM

## 2020-07-11 DIAGNOSIS — M546 Pain in thoracic spine: Secondary | ICD-10-CM

## 2020-07-11 DIAGNOSIS — M545 Low back pain, unspecified: Secondary | ICD-10-CM | POA: Diagnosis not present

## 2020-07-11 DIAGNOSIS — I700 Atherosclerosis of aorta: Secondary | ICD-10-CM

## 2020-07-11 DIAGNOSIS — W009XXA Unspecified fall due to ice and snow, initial encounter: Secondary | ICD-10-CM

## 2020-07-11 DIAGNOSIS — J069 Acute upper respiratory infection, unspecified: Secondary | ICD-10-CM

## 2020-07-11 LAB — POC SARS CORONAVIRUS 2 AG -  ED: SARS Coronavirus 2 Ag: POSITIVE — AB

## 2020-07-11 NOTE — Discharge Instructions (Signed)
°  CDC recommends isolation for 5 days from symptom onset and fever free for 24 hours without use of fever reducing medication and symptoms improving. Then wear a well fitting mask for 5 days when around others. If not improving, isolate for 10 days and consider reevaluation by a medical provider to rule out a secondary bacterial infection.  Follow up sooner if symptoms worsening this week.   Call 911 or have someone drive you to the hospital if symptoms significantly worsening.

## 2020-07-11 NOTE — ED Triage Notes (Signed)
Pt's daughter tested positive for COVID over a week ago- pt started w/ a cough, sore throat & sinus HA last Saturday  Also slipped on ice last Sunday - pain to upper back -denies LOC  No COVID vaccine

## 2020-07-11 NOTE — ED Provider Notes (Signed)
[facility]    CSN: [id number] Arrival date & time: 07/11/20  1257      History   Chief Complaint Chief Complaint  Patient presents with  . Cough  . Back Pain  . Covid Exposure    HPI Allen Richardson is a 86 y.o. male.   HPI  Allen Richardson is a 86 y.o. male presenting to UC with c/o 1 week of cough, congestion, sore throat, sinus headache and back pain. He also reports slipping and falling on the ice 1 week ago, landing on his mid back.  Pain is aching and sore,4/10. Denies radiation of pain or numbness in arms or legs. No change in bowel or bladder habits. Denies fever, chills, n/v/d. Pt reports his daughter tested positive for COVID last week  Pt not vaccinated for COVID. Pt concerned he may have pneumonia.  Denies chest pain or SOB.  Past Medical History:  Diagnosis Date  . Hyperlipidemia     There are no problems to display for this patient.   History reviewed. No pertinent surgical history.     Home Medications    Prior to Admission medications   Medication Sig Start Date End Date Taking? Authorizing Provider  b complex vitamins capsule Take 1 capsule by mouth daily.   Yes [provider]  Cholecalciferol 25 MCG (1000 UT) tablet Take by mouth.   Yes [provider]  Coenzyme Q10 100 MG capsule Take by mouth.   Yes [provider]  simvastatin (ZOCOR) 10 MG tablet Take 10 mg by mouth daily.   Yes [provider]  tamsulosin (FLOMAX) 0.4 MG CAPS capsule Take 0.4 mg by mouth.   Yes [provider]  ferrous sulfate 325 (65 FE) MG EC tablet Take 325 mg by mouth 3 (three) times daily with meals. Patient not taking: Reported on 07/11/2020    [provider]  sulfamethoxazole-trimethoprim (BACTRIM DS) 800-160 MG tablet Take 1 tablet by mouth 2 (two) times daily. Patient not taking: Reported on 07/11/2020 03/06/20   Kandra Nicolas, MD    Family History Family History  Problem Relation Age of Onset  .  Osteoarthritis Mother   . Cancer Father     Social History Social History   Tobacco Use  . Smoking status: Never Smoker  . Smokeless tobacco: Never Used  Vaping Use  . Vaping Use: Never used  Substance Use Topics  . Alcohol use: No  . Drug use: No     Allergies   Pravastatin, Alfuzosin, Carvedilol, Cefdinir, and Penicillins   Review of Systems Review of Systems  Constitutional: Negative for chills and fever.  HENT: Positive for congestion, sinus pressure and sore throat. Negative for ear pain, trouble swallowing and voice change.   Respiratory: Positive for cough. Negative for shortness of breath.   Cardiovascular: Negative for chest pain and palpitations.  Gastrointestinal: Negative for abdominal pain, diarrhea, nausea and vomiting.  Musculoskeletal: Positive for back pain and myalgias. Negative for arthralgias.  Skin: Negative for rash.  Neurological: Positive for headaches. Negative for dizziness and light-headedness.  All other systems reviewed and are negative.    Physical Exam Triage Vital Signs ED Triage Vitals  Enc Vitals Group     BP 07/11/20 1314 (!) 174/80     Pulse Rate 07/11/20 1314 71     Resp 07/11/20 1314 16     Temp 07/11/20 1314 98.5 F (36.9 C)     Temp Source 07/11/20 1314 Oral  SpO2 07/11/20 1314 99 %     Weight 07/11/20 1318 190 lb (86.2 kg)     Height 07/11/20 1318 5\' 9"  (1.753 m)     Head Circumference --      Peak Flow --      Pain Score 07/11/20 1318 4     Pain Loc --      Pain Edu? --      Excl. in [place]? --    No data found.  Updated Vital Signs BP (!) 174/80 (BP Location: Right Arm) Comment: no hx of HTN per pt  Pulse 71   Temp 98.5 F (36.9 C) (Oral)   Resp 16   Ht 5\' 9"  (1.753 m)   Wt 190 lb (86.2 kg)   SpO2 99%   BMI 28.06 kg/m   Visual Acuity Right Eye Distance:   Left Eye Distance:   Bilateral Distance:    Right Eye Near:   Left Eye Near:    Bilateral Near:     Physical Exam Vitals and nursing note  reviewed.  Constitutional:      General: He is not in acute distress.    Appearance: Normal appearance. He is well-developed and well-nourished. He is not ill-appearing, toxic-appearing or diaphoretic.  HENT:     Head: Normocephalic and atraumatic.     Right Ear: Tympanic membrane and ear canal normal.     Left Ear: Tympanic membrane and ear canal normal.  Eyes:     Extraocular Movements: EOM normal.  Cardiovascular:     Rate and Rhythm: Normal rate and regular rhythm.  Pulmonary:     Effort: Pulmonary effort is normal. No respiratory distress.     Breath sounds: Normal breath sounds. No stridor. No wheezing, rhonchi or rales.  Musculoskeletal:        General: Tenderness present. Normal range of motion.     Cervical back: Normal range of motion.  Skin:    General: Skin is warm and dry.  Neurological:     Mental Status: He is alert and oriented to person, place, and time.  Psychiatric:        Mood and Affect: Mood and affect normal.        Behavior: Behavior normal.      UC Treatments / Results  Labs (all labs ordered are listed, but only abnormal results are displayed) Labs Reviewed  POC SARS CORONAVIRUS 2 AG -  ED - Abnormal; Notable for the following components:      Result Value   SARS Coronavirus 2 Ag Positive (*)    All other components within normal limits    EKG   Radiology DG Chest 2 View  Result Date: 07/11/2020 CLINICAL DATA:  86 year old male with history of cough and congestion. Bilateral lower rib soreness. EXAM: CHEST - 2 VIEW COMPARISON:  Chest CT 07/21/2016. FINDINGS: Mild diffuse interstitial prominence, similar to prior studies. Lung volumes are normal. No consolidative airspace disease. No pleural effusions. No pneumothorax. No pulmonary nodule or mass noted. Pulmonary vasculature and the cardiomediastinal silhouette are within normal limits. Atherosclerosis in the thoracic aorta. Status post median sternotomy for CABG including LIMA. IMPRESSION: 1. No  definite radiographic evidence of acute cardiopulmonary disease. 2. Mild chronic diffuse interstitial prominence, similar to prior from 2018. 3. Aortic atherosclerosis. Electronically Signed   By: Allen Richardson M.D.   On: 07/11/2020 14:37   DG Thoracic Spine 2 View  Result Date: 07/11/2020 CLINICAL DATA:  Back pain EXAM: THORACIC SPINE 2 VIEWS COMPARISON:  07/21/2016 FINDINGS: Evaluation of the cervicothoracic junction is limited on lateral view. There is no evidence of thoracic spine fracture. Alignment is normal. No other significant bone abnormalities are identified. Post CABG changes noted within the chest. Aortic atherosclerosis. IMPRESSION: Negative. Electronically Signed   By: Davina Poke D.O.   On: 07/11/2020 14:37   DG Lumbar Spine Complete  Result Date: 07/11/2020 CLINICAL DATA:  Back pain, recent fall EXAM: LUMBAR SPINE - COMPLETE 4+ VIEW COMPARISON:  12/02/2019 FINDINGS: Five lumbar type vertebral segments. Vertebral body heights and alignment are maintained. No fracture identified. Intervertebral disc space loss with associated degenerative endplate changes, most pronounced at L4-5 and L5-S1. Lower lumbar facet arthrosis. Severe degenerative changes of the right hip. Abdominal aortic atherosclerosis. IMPRESSION: 1. No fracture or static listhesis of the lumbar spine. 2. Multilevel degenerative changes of the lumbar spine, most pronounced at L4-5 and L5-S1. Electronically Signed   By: Davina Poke D.O.   On: 07/11/2020 14:39    Procedures Procedures (including critical care time)  Medications Ordered in UC Medications - No data to display  Initial Impression / Assessment and Plan / UC Course  I have reviewed the triage vital signs and the nursing notes.  Pertinent labs & imaging results that were available during my care of the patient were reviewed by me and considered in my medical decision making (see chart for details).     Rapid COVID: POSITIVE Discussed imaging  with pt, reassured no pneumonia or acute injury on spine x-rays. Encouraged symptomatic tx F/u with PCP as needed  Final Clinical Impressions(s) / UC Diagnoses   Final diagnoses:  COVID  Viral URI with cough  Acute midline thoracic back pain  Acute bilateral low back pain without sciatica  Fall due to slipping on ice or snow, initial encounter     Discharge Instructions      CDC recommends isolation for 5 days from symptom onset and fever free for 24 hours without use of fever reducing medication and symptoms improving. Then wear a well fitting mask for 5 days when around others. If not improving, isolate for 10 days and consider reevaluation by a medical provider to rule out a secondary bacterial infection.  Follow up sooner if symptoms worsening this week.   Call 911 or have someone drive you to the hospital if symptoms significantly worsening.     ED Prescriptions    None     PDMP not reviewed this encounter.   Noe Gens, [place] 07/11/20 1621

## 2021-01-30 ENCOUNTER — Other Ambulatory Visit: Payer: Self-pay

## 2021-01-30 ENCOUNTER — Encounter: Payer: Self-pay | Admitting: Emergency Medicine

## 2021-01-30 ENCOUNTER — Emergency Department
Admission: EM | Admit: 2021-01-30 | Discharge: 2021-01-30 | Disposition: A | Discharge status: Home / Self Care | Payer: Medicare Other | Source: Home / Self Care

## 2021-01-30 DIAGNOSIS — R030 Elevated blood-pressure reading, without diagnosis of hypertension: Secondary | ICD-10-CM | POA: Diagnosis not present

## 2021-01-30 NOTE — ED Provider Notes (Signed)
[facility]    CSN: [id number] Arrival date & time: 01/30/21  0847      History   Chief Complaint Chief Complaint  Patient presents with   Hypertension    HPI Allen Richardson is a 86 y.o. male.   HPI 86 year old male presents with concerns of elevated blood pressure reports yesterday systolic blood pressure was 152 and he felt funny in the head.  Patient reports this is an elevated blood pressure for him with no previous diagnosis of hypertension.  Denies anginal equivalents, shortness of breath, lightheadedness, or dizziness, presyncopal or syncopal episodes.  Past Medical History:  Diagnosis Date   Hyperlipidemia     There are no problems to display for this patient.   No past surgical history on file.     Home Medications    Prior to Admission medications   Medication Sig Start Date End Date Taking? Authorizing Provider  b complex vitamins capsule Take 1 capsule by mouth daily.    [provider]  Cholecalciferol 25 MCG (1000 UT) tablet Take by mouth.    [provider]  Coenzyme Q10 100 MG capsule Take by mouth.    [provider]  ferrous sulfate 325 (65 FE) MG EC tablet Take 325 mg by mouth 3 (three) times daily with meals. Patient not taking: Reported on 07/11/2020    [provider]  simvastatin (ZOCOR) 10 MG tablet Take 10 mg by mouth daily.    [provider]  sulfamethoxazole-trimethoprim (BACTRIM DS) 800-160 MG tablet Take 1 tablet by mouth 2 (two) times daily. Patient not taking: Reported on 07/11/2020 03/06/20   Kandra Nicolas, MD  tamsulosin (FLOMAX) 0.4 MG CAPS capsule Take 0.4 mg by mouth.    [provider]    Family History Family History  Problem Relation Age of Onset   Osteoarthritis Mother    Cancer Father     Social History Social History   Tobacco Use   Smoking status: Never   Smokeless tobacco: Never  Vaping Use   Vaping Use: Never used  Substance Use Topics    Alcohol use: No   Drug use: No     Allergies   Pravastatin, Alfuzosin, Carvedilol, Cefdinir, and Penicillins   Review of Systems Review of Systems  Cardiovascular:        Concern with elevated blood pressure for 1-2 days    Physical Exam Triage Vital Signs ED Triage Vitals  Enc Vitals Group     BP 01/30/21 0919 (!) 153/72     Pulse Rate 01/30/21 0919 60     Resp 01/30/21 0919 16     Temp 01/30/21 0919 98 F (36.7 C)     Temp Source 01/30/21 0919 Oral     SpO2 01/30/21 0919 97 %     Weight 01/30/21 0920 188 lb (85.3 kg)     Height 01/30/21 0920 '5\' 9"'$  (1.753 m)     Head Circumference --      Peak Flow --      Pain Score 01/30/21 0920 0     Pain Loc --      Pain Edu? --      Excl. in [place]? --    No data found.  Updated Vital Signs BP (!) 153/72 (BP Location: Right Arm)   Pulse 60   Temp 98 F (36.7 C) (Oral)   Resp 16   Ht '5\' 9"'$  (1.753 m)   Wt 188 lb (85.3 kg)   SpO2  97%   BMI 27.76 kg/m   Physical Exam Vitals and nursing note reviewed.  Constitutional:      General: He is not in acute distress.    Appearance: Normal appearance. He is normal weight.  HENT:     Head: Normocephalic and atraumatic.     Right Ear: Tympanic membrane, ear canal and external ear normal.     Left Ear: Tympanic membrane, ear canal and external ear normal.     Nose: Nose normal. No congestion or rhinorrhea.     Mouth/Throat:     Mouth: Mucous membranes are moist.     Pharynx: Oropharynx is clear. Posterior oropharyngeal erythema present.  Eyes:     Extraocular Movements: Extraocular movements intact.     Conjunctiva/sclera: Conjunctivae normal.     Pupils: Pupils are equal, round, and reactive to light.  Neck:     Comments: No JVD, no bruit Cardiovascular:     Rate and Rhythm: Normal rate and regular rhythm.     Pulses: Normal pulses.     Heart sounds: Murmur heard.     Comments: 3/6 HSEM noted @ left sternal border Pulmonary:     Effort: Pulmonary effort is normal. No  respiratory distress.     Breath sounds: Rhonchi present. No wheezing.  Musculoskeletal:        General: Normal range of motion.     Cervical back: Normal range of motion and neck supple.  Skin:    General: Skin is warm and dry.  Neurological:     General: No focal deficit present.     Mental Status: He is alert and oriented to person, place, and time. Mental status is at baseline.  Psychiatric:        Mood and Affect: Mood normal.        Behavior: Behavior normal.        Thought Content: Thought content normal.     UC Treatments / Results  Labs (all labs ordered are listed, but only abnormal results are displayed) Labs Reviewed - No data to display  EKG   Radiology No results found.  Procedures Procedures (including critical care time)  Medications Ordered in UC Medications - No data to display  Initial Impression / Assessment and Plan / UC Course  I have reviewed the triage vital signs and the nursing notes.  Pertinent labs & imaging results that were available during my care of the patient were reviewed by me and considered in my medical decision making (see chart for details).     MDM: 1.  Elevated blood pressure reading without diagnosis of hypertension-Advised/encouraged patient to take blood pressure twice daily (he once a.m. prior to breakfast and once p.m. prior to dinner) and to log BP measurements for 7 to 10 days so that PCP may evaluate daily blood pressure trends.  Advised/encouraged patient increase daily water intake.  Encourage patient to adhere to low-sodium diet. Final Clinical Impressions(s) / UC Diagnoses   Final diagnoses:  Elevated blood pressure reading without diagnosis of hypertension     Discharge Instructions      Advised/encouraged patient to take blood pressure twice daily (he once a.m. prior to breakfast and once p.m. prior to dinner) and to log BP measurements for 7 to 10 days so that PCP may evaluate daily blood pressure trends.   Advised/encouraged patient increase daily water intake.  Encourage patient to adhere to low-sodium diet.     ED Prescriptions   None    PDMP not reviewed  this encounter.   Eliezer Lofts, [place] 01/30/21 1004

## 2021-01-30 NOTE — Discharge Instructions (Addendum)
Advised/encouraged patient to take blood pressure twice daily (he once a.m. prior to breakfast and once p.m. prior to dinner) and to log BP measurements for 7 to 10 days so that PCP may evaluate daily blood pressure trends.  Advised/encouraged patient increase daily water intake.  Encourage patient to adhere to low-sodium diet.

## 2021-01-30 NOTE — ED Triage Notes (Signed)
Patient here reporting his home BP is elevated and yesterday he felt "funny in the head"; states his systolic bp was 0000000, which is high for him. Denies previous dx of hypertension. Denies headache or edema. Answering questions appropriately. Has had covid; no vaccinations because he doesn't believe they are necessary.

## 2021-05-22 ENCOUNTER — Other Ambulatory Visit: Payer: Self-pay

## 2021-05-22 ENCOUNTER — Emergency Department
Admission: EM | Admit: 2021-05-22 | Discharge: 2021-05-22 | Disposition: A | Discharge status: Home / Self Care | Payer: Medicare Other | Source: Home / Self Care

## 2021-05-22 DIAGNOSIS — R059 Cough, unspecified: Secondary | ICD-10-CM

## 2021-05-22 MED ORDER — BENZONATATE 200 MG PO CAPS: 200.0000 mg | ORAL_CAPSULE | Freq: Three times a day (TID) | ORAL | 0 refills | Status: AC | PRN

## 2021-05-22 MED ORDER — DOXYCYCLINE HYCLATE 100 MG PO CAPS: 100.0000 mg | ORAL_CAPSULE | Freq: Two times a day (BID) | ORAL | 0 refills | Status: AC

## 2021-05-22 NOTE — Discharge Instructions (Addendum)
Advised/instructed patient to take medication as directed with food to completion.  Advised patient may take Tessalon Perles daily or as needed for cough.  Encouraged patient to increase daily water intake while taking these medications.

## 2021-05-22 NOTE — ED Provider Notes (Signed)
[facility]    CSN: [id number] Arrival date & time: 05/22/21  0932      History   Chief Complaint Chief Complaint  Patient presents with   Cough    Cough, chest congestion, and scratchy throat. X5 days    HPI Allen Richardson is a 86 y.o. male.   HPI 86 year old male presents with cough, chest congestion, and scratchy throat for 5 days.  PMH significant for CAD with CABG s/p MI, mild late onset dementia with anxiety, and nonrheumatic aortic valve stenosis.  Past Medical History:  Diagnosis Date   Hyperlipidemia     There are no problems to display for this patient.   History reviewed. No pertinent surgical history.     Home Medications    Prior to Admission medications   Medication Sig Start Date End Date Taking? Authorizing Provider  aspirin 81 MG chewable tablet Chew by mouth.   Yes [provider]  b complex vitamins capsule Take 1 capsule by mouth daily.   Yes [provider]  benzonatate (TESSALON) 200 MG capsule Take 1 capsule (200 mg total) by mouth 3 (three) times daily as needed for up to 7 days for cough. 05/22/21 05/29/21 Yes Eliezer Lofts, FNP  Cholecalciferol 25 MCG (1000 UT) tablet Take by mouth.   Yes [provider]  Coenzyme Q10 100 MG capsule Take by mouth.   Yes [provider]  doxycycline (VIBRAMYCIN) 100 MG capsule Take 1 capsule (100 mg total) by mouth 2 (two) times daily for 7 days. 05/22/21 05/29/21 Yes Eliezer Lofts, FNP  nitroGLYCERIN (NITROSTAT) 0.4 MG SL tablet Place under the tongue. 05/10/21  Yes [provider]  Omega-3 Fatty Acids (FISH OIL) 1000 MG CAPS Take by mouth.   Yes [provider]  simvastatin (ZOCOR) 10 MG tablet Take 10 mg by mouth daily.   Yes [provider]  tamsulosin (FLOMAX) 0.4 MG CAPS capsule Take 0.4 mg by mouth.   Yes [provider]  vitamin B-12 (CYANOCOBALAMIN) 500 MCG tablet Take by mouth.   Yes [provider]  ferrous  sulfate 325 (65 FE) MG EC tablet Take 325 mg by mouth 3 (three) times daily with meals. Patient not taking: Reported on 07/11/2020    [provider]  sulfamethoxazole-trimethoprim (BACTRIM DS) 800-160 MG tablet Take 1 tablet by mouth 2 (two) times daily. Patient not taking: Reported on 07/11/2020 03/06/20   Kandra Nicolas, MD    Family History Family History  Problem Relation Age of Onset   Osteoarthritis Mother    Cancer Father     Social History Social History   Tobacco Use   Smoking status: Never   Smokeless tobacco: Never  Vaping Use   Vaping Use: Never used  Substance Use Topics   Alcohol use: No   Drug use: No     Allergies   Pravastatin, Alfuzosin, Carvedilol, Cefdinir, and Penicillins   Review of Systems Review of Systems  HENT:         Scratchy throat x5 days  Respiratory:  Positive for cough.   All other systems reviewed and are negative.   Physical Exam Triage Vital Signs ED Triage Vitals  Enc Vitals Group     BP 05/22/21 0958 136/75     Pulse Rate 05/22/21 0958 67     Resp 05/22/21 0958 18     Temp 05/22/21 0958 98.4 F (36.9 C)     Temp Source 05/22/21 0958 Oral  SpO2 05/22/21 0958 97 %     Weight 05/22/21 0955 190 lb (86.2 kg)     Height 05/22/21 0955 5\' 9"  (1.753 m)     Head Circumference --      Peak Flow --      Pain Score 05/22/21 0954 0     Pain Loc --      Pain Edu? --      Excl. in [place]? --    No data found.  Updated Vital Signs BP 136/75 (BP Location: Left Arm)   Pulse 67   Temp 98.4 F (36.9 C) (Oral)   Resp 18   Ht 5\' 9"  (1.753 m)   Wt 190 lb (86.2 kg)   SpO2 97%   BMI 28.06 kg/m    Physical Exam Vitals and nursing note reviewed.  Constitutional:      General: He is not in acute distress.    Appearance: Normal appearance. He is obese. He is not ill-appearing.  HENT:     Head: Normocephalic and atraumatic.     Right Ear: Tympanic membrane, ear canal and external ear normal.     Left Ear: Tympanic  membrane, ear canal and external ear normal.     Mouth/Throat:     Mouth: Mucous membranes are moist.     Pharynx: Oropharynx is clear.  Eyes:     Extraocular Movements: Extraocular movements intact.     Conjunctiva/sclera: Conjunctivae normal.     Pupils: Pupils are equal, round, and reactive to light.  Cardiovascular:     Rate and Rhythm: Normal rate and regular rhythm.     Pulses: Normal pulses.     Heart sounds: Normal heart sounds.  Pulmonary:     Effort: Pulmonary effort is normal.     Breath sounds: Normal breath sounds.  Musculoskeletal:        General: Normal range of motion.     Cervical back: Normal range of motion and neck supple.  Skin:    General: Skin is warm and dry.  Neurological:     General: No focal deficit present.     Mental Status: He is alert and oriented to person, place, and time. Mental status is at baseline.     UC Treatments / Results  Labs (all labs ordered are listed, but only abnormal results are displayed) Labs Reviewed - No data to display  EKG   Radiology No results found.  Procedures Procedures (including critical care time)  Medications Ordered in UC Medications - No data to display  Initial Impression / Assessment and Plan / UC Course  I have reviewed the triage vital signs and the nursing notes.  Pertinent labs & imaging results that were available during my care of the patient were reviewed by me and considered in my medical decision making (see chart for details).     MDM: 1.  Cough-Rx'd Doxycycline and Tessalon Perles. Advised/instructed patient to take medication as directed with food to completion.  Advised patient may take Tessalon Perles daily or as needed for cough.  Encouraged patient to increase daily water intake while taking these medications.  Patient discharged home, hemodynamically stable. Final Clinical Impressions(s) / UC Diagnoses   Final diagnoses:  Cough, unspecified type     Discharge Instructions       Advised/instructed patient to take medication as directed with food to completion.  Advised patient may take Tessalon Perles daily or as needed for cough.  Encouraged patient to increase daily water intake while taking  these medications.     ED Prescriptions     Medication Sig Dispense Auth. Provider   doxycycline (VIBRAMYCIN) 100 MG capsule Take 1 capsule (100 mg total) by mouth 2 (two) times daily for 7 days. 14 capsule Eliezer Lofts, FNP   benzonatate (TESSALON) 200 MG capsule Take 1 capsule (200 mg total) by mouth 3 (three) times daily as needed for up to 7 days for cough. 40 capsule Eliezer Lofts, FNP      PDMP not reviewed this encounter.   Eliezer Lofts, [place] 05/22/21 [phone]

## 2021-05-22 NOTE — ED Triage Notes (Signed)
Pt states that he has a cough, chest congestion and scratchy throat.   Pt states that he isn't vaccinated for covid.  Pt states that he has had his flu vaccine.

## 2021-10-27 ENCOUNTER — Telehealth: Payer: Self-pay | Admitting: Emergency Medicine

## 2021-10-27 ENCOUNTER — Emergency Department
Admission: RE | Admit: 2021-10-27 | Discharge: 2021-10-27 | Disposition: A | Discharge status: Home / Self Care | Payer: Medicare Other | Source: Ambulatory Visit

## 2021-10-27 ENCOUNTER — Other Ambulatory Visit: Payer: Self-pay

## 2021-10-27 VITALS — BP 148/73 | HR 60 | Temp 97.9°F | Resp 16 | Ht 69.0 in | Wt 185.0 lb

## 2021-10-27 DIAGNOSIS — J3489 Other specified disorders of nose and nasal sinuses: Secondary | ICD-10-CM | POA: Diagnosis not present

## 2021-10-27 DIAGNOSIS — J0100 Acute maxillary sinusitis, unspecified: Secondary | ICD-10-CM | POA: Diagnosis not present

## 2021-10-27 DIAGNOSIS — R110 Nausea: Secondary | ICD-10-CM

## 2021-10-27 HISTORY — DX: Gastro-esophageal reflux disease without esophagitis: K21.9

## 2021-10-27 HISTORY — DX: Heart disease, unspecified: I51.9

## 2021-10-27 HISTORY — DX: Anxiety disorder, unspecified: F41.9

## 2021-10-27 MED ORDER — BENZONATATE 200 MG PO CAPS: 200.0000 mg | ORAL_CAPSULE | Freq: Three times a day (TID) | ORAL | 0 refills | Status: AC | PRN

## 2021-10-27 MED ORDER — DOXYCYCLINE HYCLATE 100 MG PO CAPS: 100.0000 mg | ORAL_CAPSULE | Freq: Two times a day (BID) | ORAL | 0 refills | Status: AC

## 2021-10-27 MED ORDER — METHYLPREDNISOLONE SODIUM SUCC 125 MG IJ SOLR
125.0000 mg | Freq: Once | INTRAMUSCULAR | Status: AC
  Administered 2021-10-27: 125 mg via INTRAMUSCULAR

## 2021-10-27 MED ORDER — ONDANSETRON 8 MG PO TBDP: 8.0000 mg | ORAL_TABLET | Freq: Three times a day (TID) | ORAL | 0 refills | Status: DC | PRN

## 2021-10-27 NOTE — ED Triage Notes (Signed)
Pt c/o sinus HA, nausea, and nasal congestion x 3 days. Reports low grade fever on day one.  ?

## 2021-10-27 NOTE — Discharge Instructions (Addendum)
Advised patient take medication as directed with food to completion.  Advised patient may take nausea Zofran daily or as needed for nausea.  Advised may take Tessalon Perles daily as needed for cough.  Encouraged patient to increase daily water intake while taking these medications.  Advised patient if symptoms worsen and/or unresolved please follow-up with PCP or here for further evaluation. ?

## 2021-10-27 NOTE — Telephone Encounter (Signed)
Call back to pharmacy regarding tessalon perles - not covered by pt's plan. RN to call pt back about medication. Out of pocket cost is $53 - 59 per pharmacist. Call to [facility] regarding medicine - message left for a call back.  ?

## 2021-10-27 NOTE — ED Provider Notes (Signed)
?[facility] ? ? ? ?CSN: [id number] ?Arrival date & time: 10/27/21  0840 ? ? ?  ? ?History   ?Chief Complaint ?Chief Complaint  ?Patient presents with  ? Nasal Congestion  ?  Headache and nausea - Entered by patient  ? Headache  ? Nausea  ? ? ?HPI ?Allen Richardson is a 87 y.o. male.  ? ?HPI is an 87 year old male presents with headache, sinus nasal congestion, nausea for 3 days.  PMH significant for CAD and HLD. Patient is accompanied by his daughter this morning. ? ?Past Medical History:  ?Diagnosis Date  ? Anxiety   ? GERD (gastroesophageal reflux disease)   ? Heart disease   ? Hyperlipidemia   ? ? ?There are no problems to display for this patient. ? ? ?Past Surgical History:  ?Procedure Laterality Date  ? CARDIAC SURGERY    ? GANGLION CYST EXCISION    ? ? ? ? ? ?Home Medications   ? ?Prior to Admission medications   ?Medication Sig Start Date End Date Taking? Authorizing Provider  ?benzonatate (TESSALON) 200 MG capsule Take 1 capsule (200 mg total) by mouth 3 (three) times daily as needed for up to 7 days for cough. 10/27/21 11/03/21 Yes Eliezer Lofts, FNP  ?doxycycline (VIBRAMYCIN) 100 MG capsule Take 1 capsule (100 mg total) by mouth 2 (two) times daily for 10 days. 10/27/21 11/06/21 Yes Eliezer Lofts, FNP  ?famotidine (PEPCID) 20 MG tablet Take by mouth. 08/24/21  Yes [provider]  ?lisinopril (ZESTRIL) 10 MG tablet Take 10 mg by mouth daily.   Yes [provider]  ?ondansetron (ZOFRAN-ODT) 8 MG disintegrating tablet Take 1 tablet (8 mg total) by mouth every 8 (eight) hours as needed for nausea or vomiting. 10/27/21  Yes Eliezer Lofts, FNP  ?aspirin 81 MG chewable tablet Chew by mouth.    [provider]  ?b complex vitamins capsule Take 1 capsule by mouth daily.    [provider]  ?Cholecalciferol 25 MCG (1000 UT) tablet Take by mouth.    [provider]  ?Coenzyme Q10 100 MG capsule Take by mouth.    [provider]  ?FLUoxetine (PROZAC) 10 MG  capsule Take 10 mg by mouth daily. 09/10/21   [provider]  ?nitroGLYCERIN (NITROSTAT) 0.4 MG SL tablet Place under the tongue. 05/10/21   [provider]  ?Omega-3 Fatty Acids (FISH OIL) 1000 MG CAPS Take by mouth.    [provider]  ?simvastatin (ZOCOR) 10 MG tablet Take 10 mg by mouth daily.    [provider]  ?tamsulosin (FLOMAX) 0.4 MG CAPS capsule Take 0.4 mg by mouth.    [provider]  ?vitamin B-12 (CYANOCOBALAMIN) 500 MCG tablet Take by mouth.    [provider]  ? ? ?Family History ?Family History  ?Problem Relation Age of Onset  ? Osteoarthritis Mother   ? Cancer Father   ? ? ?Social History ?Social History  ? ?Tobacco Use  ? Smoking status: Never  ? Smokeless tobacco: Never  ?Vaping Use  ? Vaping Use: Never used  ?Substance Use Topics  ? Alcohol use: No  ? Drug use: No  ? ? ? ?Allergies   ?Pravastatin, Alfuzosin, Carvedilol, Cefdinir, and Penicillins ? ? ?Review of Systems ?Review of Systems  ?HENT:  Positive for congestion, sinus pressure and sinus pain.   ?Gastrointestinal:  Positive for nausea.  ?Neurological:  Positive for headaches.  ?All other systems reviewed and are negative. ? ? ?Physical Exam ?Triage  Vital Signs ?ED Triage Vitals  ?Enc Vitals Group  ?   BP 10/27/21 0857 (!) 148/73  ?   Pulse Rate 10/27/21 0857 60  ?   Resp 10/27/21 0857 16  ?   Temp 10/27/21 0857 97.9 ?F (36.6 ?C)  ?   Temp Source 10/27/21 0857 Oral  ?   SpO2 10/27/21 0857 99 %  ?   Weight 10/27/21 0851 185 lb (83.9 kg)  ?   Height 10/27/21 0851 '5\' 9"'$  (1.753 m)  ?   Head Circumference --   ?   Peak Flow --   ?   Pain Score 10/27/21 0851 3  ?   Pain Loc --   ?   Pain Edu? --   ?   Excl. in [place]? --   ? ?No data found. ? ?Updated Vital Signs ?BP (!) 148/73 (BP Location: Right Arm)   Pulse 60   Temp 97.9 ?F (36.6 ?C) (Oral)   Resp 16   Ht '5\' 9"'$  (1.753 m)   Wt 185 lb (83.9 kg)   SpO2 99%   BMI 27.32 kg/m?  ? ?    ? ?Physical Exam ?Vitals and nursing note reviewed.   ?Constitutional:   ?   Appearance: Normal appearance.  ?HENT:  ?   Head: Normocephalic.  ?   Right Ear: External ear normal.  ?   Left Ear: External ear normal.  ?   Ears:  ?   Comments: Moderate eustachian tube dysfunction noted bilaterally; TMs are dull mildly retracted ?   Mouth/Throat:  ?   Mouth: Mucous membranes are moist.  ?   Pharynx: Oropharynx is clear.  ?Eyes:  ?   Extraocular Movements: Extraocular movements intact.  ?   Conjunctiva/sclera: Conjunctivae normal.  ?   Pupils: Pupils are equal, round, and reactive to light.  ?Cardiovascular:  ?   Rate and Rhythm: Normal rate and regular rhythm.  ?   Pulses: Normal pulses.  ?   Heart sounds: Murmur heard.  ?   Comments: 3/6 HSEM noted ?Pulmonary:  ?   Effort: Pulmonary effort is normal.  ?   Breath sounds: Rhonchi present. No wheezing or rales.  ?   Comments: Mild expiratory rhonchi noted over left middle and lower fields ?Musculoskeletal:  ?   Cervical back: Normal range of motion and neck supple.  ?Skin: ?   General: Skin is warm and dry.  ?Neurological:  ?   General: No focal deficit present.  ?   Mental Status: He is alert and oriented to person, place, and time.  ? ? ? ?UC Treatments / Results  ?Labs ?(all labs ordered are listed, but only abnormal results are displayed) ?Labs Reviewed - No data to display ? ?EKG ? ? ?Radiology ?No results found. ? ?Procedures ?Procedures (including critical care time) ? ?Medications Ordered in UC ?Medications  ?methylPREDNISolone sodium succinate (SOLU-MEDROL) 125 mg/2 mL injection 125 mg (125 mg Intramuscular Given 10/27/21 0927)  ? ? ?Initial Impression / Assessment and Plan / UC Course  ?I have reviewed the triage vital signs and the nursing notes. ? ?Pertinent labs & imaging results that were available during my care of the patient were reviewed by me and considered in my medical decision making (see chart for details). ? ?  ? ?MDM: 1.  Acute nonrecurrent maxillary sinusitis-Rx Doxycycline; 2. Nausea-Rx'd Zofran;  3.  Sinus pressure-IM Solu-Medrol 125 mg given once in clinic. Advised patient take medication as directed with food to completion.  Advised  patient may take nausea Zofran daily or as needed for nausea.  Advised may take Tessalon Perles daily as needed for cough.  Encouraged patient to increase daily water intake while taking these medications.  Advised patient if symptoms worsen and/or unresolved please follow-up with PCP or here for further evaluation.  Patient discharged home, hemodynamically stable. ?Final Clinical Impressions(s) / UC Diagnoses  ? ?Final diagnoses:  ?Nausea  ?Acute non-recurrent maxillary sinusitis  ?Sinus pressure  ? ? ? ?Discharge Instructions   ? ?  ?Advised patient take medication as directed with food to completion.  Advised patient may take nausea Zofran daily or as needed for nausea.  Advised may take Tessalon Perles daily as needed for cough.  Encouraged patient to increase daily water intake while taking these medications.  Advised patient if symptoms worsen and/or unresolved please follow-up with PCP or here for further evaluation. ? ? ? ? ?ED Prescriptions   ? ? Medication Sig Dispense Auth. Provider  ? doxycycline (VIBRAMYCIN) 100 MG capsule Take 1 capsule (100 mg total) by mouth 2 (two) times daily for 10 days. 20 capsule Eliezer Lofts, FNP  ? ondansetron (ZOFRAN-ODT) 8 MG disintegrating tablet Take 1 tablet (8 mg total) by mouth every 8 (eight) hours as needed for nausea or vomiting. 24 tablet Eliezer Lofts, FNP  ? benzonatate (TESSALON) 200 MG capsule Take 1 capsule (200 mg total) by mouth 3 (three) times daily as needed for up to 7 days for cough. 40 capsule Eliezer Lofts, FNP  ? ?  ? ?PDMP not reviewed this encounter. ?  ?Eliezer Lofts, [place] ?10/27/21 7412 ? ?

## 2021-10-27 NOTE — Telephone Encounter (Signed)
Call back from Allen Richardson's daughter - enroute to pharmacy - plan is to pick up medicine & pay out of pocket. Pt will call  back if there are any problems.  ?

## 2021-10-28 ENCOUNTER — Telehealth: Payer: Self-pay | Admitting: Emergency Medicine

## 2021-10-28 MED ORDER — AMOXICILLIN 875 MG PO TABS: 875.0000 mg | ORAL_TABLET | Freq: Two times a day (BID) | ORAL | 0 refills | Status: AC

## 2021-10-28 NOTE — Telephone Encounter (Signed)
Call from Allen Richardson's daughter - pt gave verbal consent for RN to speak with daughter - Per Allen Richardson & daughter, he is not tolerating the doxycycline. He has had the same problem in the past. It makes him feel "strange and keeps him awake". Pt is requesting a different antibiotic. RN to review with provider & will call pt & daughter back. Allen Richardson also doid not pick up tessalon perles but states cough is not bad. RN to follow up w/ provider regarding OTC  cough meds such as delsym.  ?

## 2021-10-28 NOTE — Telephone Encounter (Signed)
Call back from [place] regarding medication change - RN reviewed change in antibiotic ( daughter to watch for signs of an allergic reaction). Flora for Raymondo to take delsym in the am or pm if he decides to not take tessalon perles. Daughter confirms that Kendarrius sleeps with a CPAP but she cleans it as directed. No other questions at this time  ?

## 2021-10-28 NOTE — Telephone Encounter (Signed)
Patient reports intolerance towards Doxycycline making him loopy, strange and disrupting sleep hygiene.  Patient is unsure if he has a Penicillin allergy or not, we will start Amoxicillin twice daily x 7 days.  Advised patient if intolerant to this antibiotic please follow-up with PCP for further evaluation. ?

## 2021-10-28 NOTE — Telephone Encounter (Addendum)
New prescription sent in for a different antibiotic - call back to [facility] &  daughter - no answer - RN to attempt later on today. Amoxicillin prescribed per provider since pt is not sure if he is allergic to PCN. Okay to take delsym per provider. ?

## 2022-05-15 ENCOUNTER — Ambulatory Visit
Admission: EM | Admit: 2022-05-15 | Discharge: 2022-05-15 | Disposition: A | Discharge status: Home / Self Care | Payer: Medicare Other | Attending: Family Medicine | Admitting: Family Medicine

## 2022-05-15 ENCOUNTER — Encounter: Payer: Self-pay | Admitting: Emergency Medicine

## 2022-05-15 DIAGNOSIS — J069 Acute upper respiratory infection, unspecified: Secondary | ICD-10-CM | POA: Diagnosis not present

## 2022-05-15 DIAGNOSIS — Z8709 Personal history of other diseases of the respiratory system: Secondary | ICD-10-CM

## 2022-05-15 MED ORDER — AZITHROMYCIN 250 MG PO TABS: 250.0000 mg | ORAL_TABLET | Freq: Every day | ORAL | 0 refills | Status: AC

## 2022-05-15 MED ORDER — DM-GUAIFENESIN ER 30-600 MG PO TB12: 1.0000 | ORAL_TABLET | Freq: Two times a day (BID) | ORAL | 0 refills | Status: AC

## 2022-05-15 NOTE — ED Triage Notes (Signed)
Patient presents to Urgent Care with complaints of cough, nasal drainage, stuffy nose, headache since 3 days ago. Patient reports symptoms gradually worsening each day. Has not taken any medication otc for symptoms. Denies any fever or chills.

## 2022-05-15 NOTE — ED Provider Notes (Signed)
[facility]    CSN: [id number] Arrival date & time: 05/15/22  7322      History   Chief Complaint Chief Complaint  Patient presents with   Facial Pain   Nasal Congestion    HPI Allen Richardson is a 87 y.o. male.   HPI  Patient states he has been sick for about 2 weeks.  Worse the last 4 days.  Cough and congestion.  Some postnasal drip.  Feeling "drugged out".  States he has a "rattle" in his chest.  Denies fever and chills.  Has a headache with coughing.  No chest pain or shortness of breath.  States he had asthma as a child.  Past Medical History:  Diagnosis Date   Anxiety    GERD (gastroesophageal reflux disease)    Heart disease    Hyperlipidemia     There are no problems to display for this patient.   Past Surgical History:  Procedure Laterality Date   CARDIAC SURGERY     GANGLION CYST EXCISION         Home Medications    Prior to Admission medications   Medication Sig Start Date End Date Taking? Authorizing Provider  aspirin 81 MG chewable tablet Chew by mouth.   Yes [provider]  azithromycin (ZITHROMAX) 250 MG tablet Take 1 tablet (250 mg total) by mouth daily. Take first 2 tablets together, then 1 every day until finished. 05/15/22  Yes Raylene Everts, MD  b complex vitamins capsule Take 1 capsule by mouth daily.   Yes [provider]  Cholecalciferol 25 MCG (1000 UT) tablet Take by mouth.   Yes [provider]  Coenzyme Q10 100 MG capsule Take by mouth.   Yes [provider]  dextromethorphan-guaiFENesin (MUCINEX DM) 30-600 MG 12hr tablet Take 1 tablet by mouth 2 (two) times daily. 05/15/22  Yes Raylene Everts, MD  escitalopram (LEXAPRO) 5 MG tablet Take 5 mg by mouth daily.   Yes [provider]  famotidine (PEPCID) 20 MG tablet Take by mouth. 08/24/21  Yes [provider]  lisinopril (ZESTRIL) 10 MG tablet Take 10 mg by mouth daily.   Yes [provider]   nitroGLYCERIN (NITROSTAT) 0.4 MG SL tablet Place under the tongue. 05/10/21  Yes [provider]  Omega-3 Fatty Acids (FISH OIL) 1000 MG CAPS Take by mouth.   Yes [provider]  simvastatin (ZOCOR) 10 MG tablet Take 10 mg by mouth daily.   Yes [provider]  tamsulosin (FLOMAX) 0.4 MG CAPS capsule Take 0.4 mg by mouth.   Yes [provider]  vitamin B-12 (CYANOCOBALAMIN) 500 MCG tablet Take by mouth.   Yes [provider]    Family History Family History  Problem Relation Age of Onset   Osteoarthritis Mother    Cancer Father     Social History Social History   Tobacco Use   Smoking status: Never   Smokeless tobacco: Never  Vaping Use   Vaping Use: Never used  Substance Use Topics   Alcohol use: No   Drug use: No     Allergies   Pravastatin, Alfuzosin, Carvedilol, Cefdinir, and Penicillins   Review of Systems Review of Systems See HPI  Physical Exam Triage Vital Signs ED Triage Vitals  Enc Vitals Group     BP 05/15/22 0917 129/68     Pulse Rate 05/15/22 0917 (!) 56     Resp 05/15/22 0917 16     Temp 05/15/22  0917 99.1 F (37.3 C)     Temp Source 05/15/22 0917 Oral     SpO2 05/15/22 0917 97 %     Weight --      Height --      Head Circumference --      Peak Flow --      Pain Score 05/15/22 0914 3     Pain Loc --      Pain Edu? --      Excl. in [place]? --    No data found.  Updated Vital Signs BP 129/68 (BP Location: Left Arm)   Pulse (!) 56   Temp 99.1 F (37.3 C) (Oral)   Resp 16   SpO2 97%      Physical Exam Constitutional:      General: He is not in acute distress.    Appearance: He is well-developed. He is ill-appearing.  HENT:     Head: Normocephalic and atraumatic.     Right Ear: Tympanic membrane and ear canal normal.     Left Ear: Tympanic membrane and ear canal normal.     Nose: Nose normal. No congestion.     Mouth/Throat:     Pharynx: Posterior oropharyngeal erythema present.      Comments: Lymphoid hyperplasia posterior pharynx with postnasal drip Eyes:     Conjunctiva/sclera: Conjunctivae normal.     Pupils: Pupils are equal, round, and reactive to light.  Cardiovascular:     Rate and Rhythm: Normal rate and regular rhythm.     Heart sounds: Normal heart sounds.  Pulmonary:     Effort: Pulmonary effort is normal. No respiratory distress.     Breath sounds: Wheezing and rhonchi present. No rales.     Comments: Faint rhonchi and wheeze centrally Abdominal:     General: There is no distension.     Palpations: Abdomen is soft.  Musculoskeletal:        General: Normal range of motion.     Cervical back: Normal range of motion.  Skin:    General: Skin is warm and dry.  Neurological:     Mental Status: He is alert.  Psychiatric:        Mood and Affect: Mood normal.        Behavior: Behavior normal.      UC Treatments / Results  Labs (all labs ordered are listed, but only abnormal results are displayed) Labs Reviewed - No data to display  EKG   Radiology No results found.  Procedures Procedures (including critical care time)  Medications Ordered in UC Medications - No data to display  Initial Impression / Assessment and Plan / UC Course  I have reviewed the triage vital signs and the nursing notes.  Pertinent labs & imaging results that were available during my care of the patient were reviewed by me and considered in my medical decision making (see chart for details).     Final Clinical Impressions(s) / UC Diagnoses   Final diagnoses:  Acute upper respiratory infection  History of asthma     Discharge Instructions      Make sure you drink lots of water and liquids Take Mucinex DM 2 times a day for the cough and congestion Take the azithromycin antibiotic as directed See your doctor if not improving by next week Use albuterol if you feel shortness of breath or wheezing    ED Prescriptions     Medication Sig Dispense Auth.  Provider   azithromycin (ZITHROMAX) 250 MG tablet  Take 1 tablet (250 mg total) by mouth daily. Take first 2 tablets together, then 1 every day until finished. 6 tablet Raylene Everts, MD   dextromethorphan-guaiFENesin [facility] DM) 30-600 MG 12hr tablet Take 1 tablet by mouth 2 (two) times daily. 20 tablet Raylene Everts, MD      PDMP not reviewed this encounter.   Raylene Everts, MD 05/15/22 1004

## 2022-05-15 NOTE — Discharge Instructions (Signed)
Make sure you drink lots of water and liquids Take Mucinex DM 2 times a day for the cough and congestion Take the azithromycin antibiotic as directed See your doctor if not improving by next week Use albuterol if you feel shortness of breath or wheezing

## 2022-07-06 IMAGING — DX DG LUMBAR SPINE COMPLETE 4+V
5 series · 5 of 5 positions shown · non-contrast
Comparison: 12/02/2019

CLINICAL DATA: Back pain, recent fall

EXAM:
LUMBAR SPINE - COMPLETE 4+ VIEW

[l-spine ap]
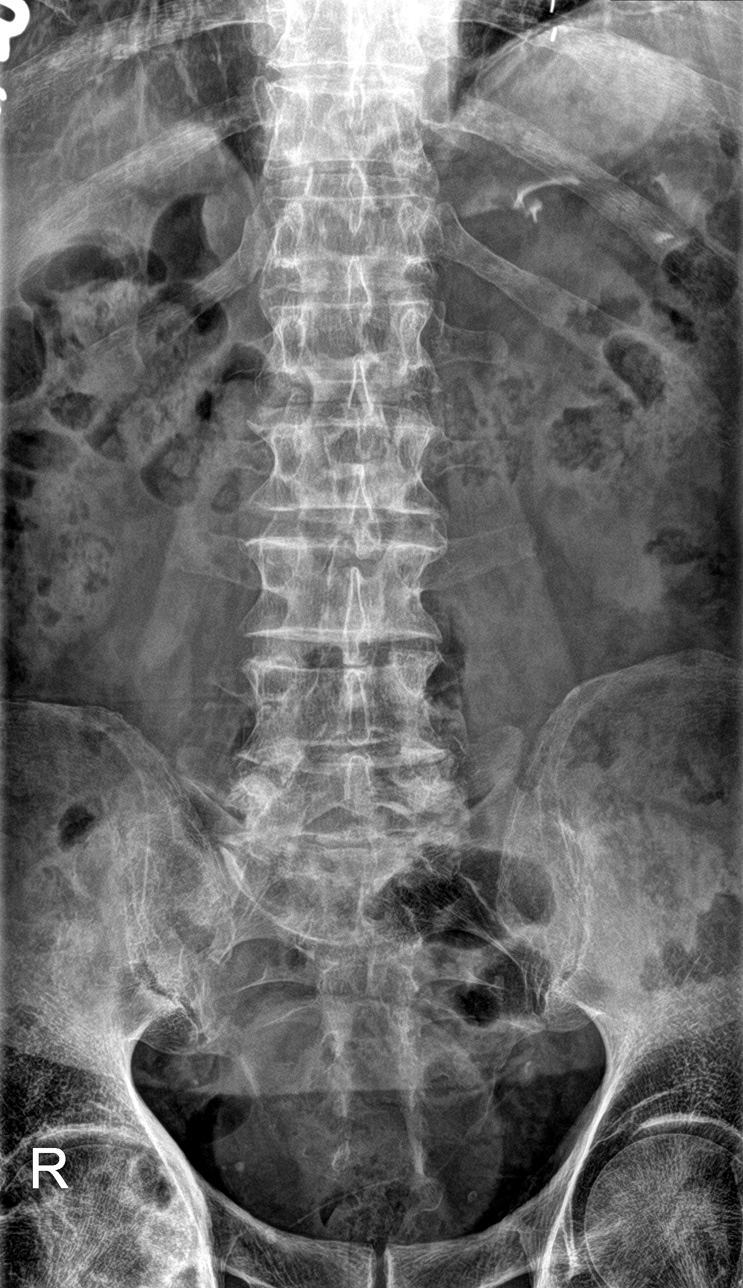

[l-spine obl (1 of 2)]
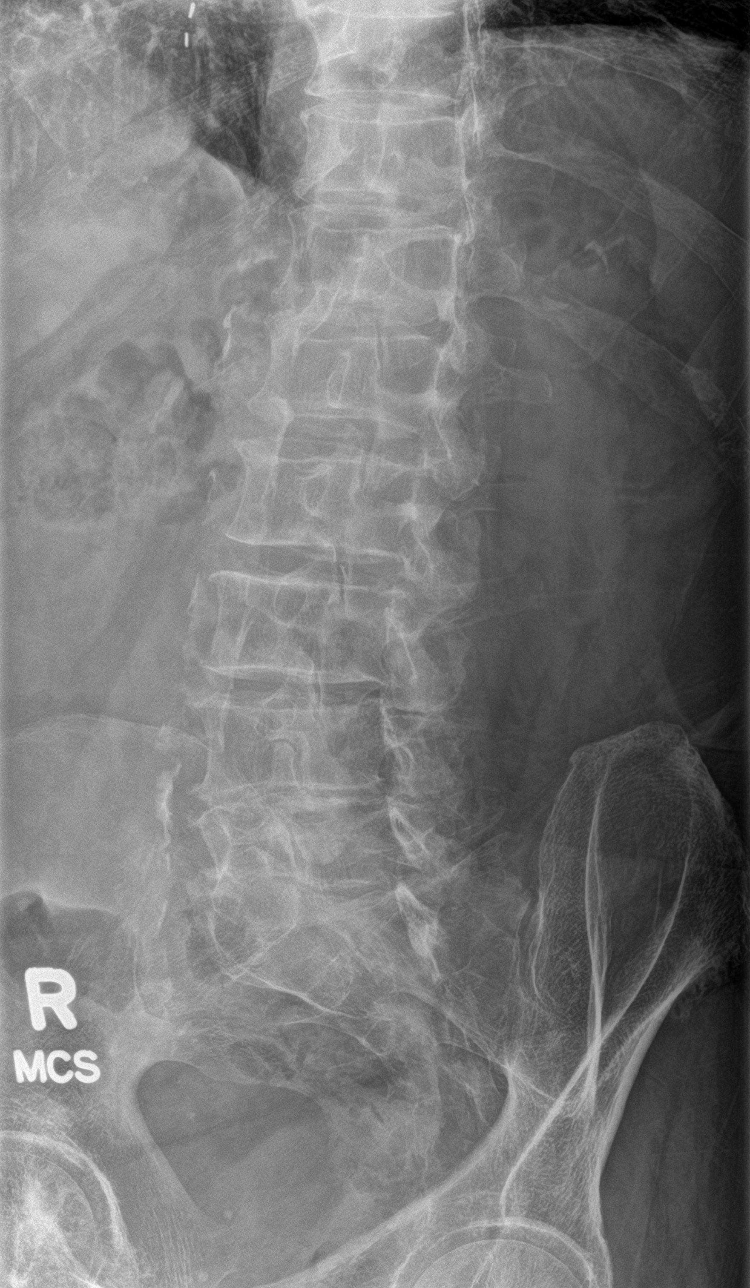

[l-spine obl (2 of 2)]
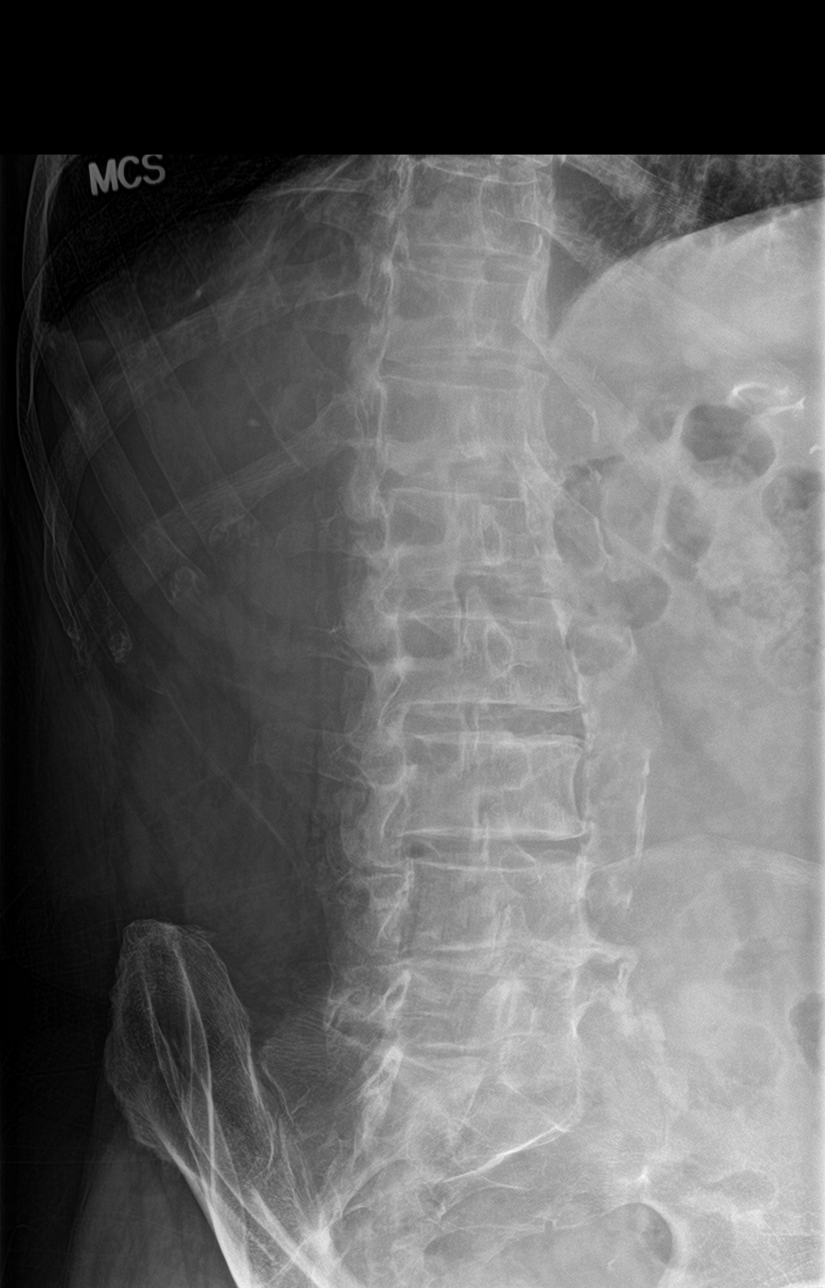

[l-spine lat]
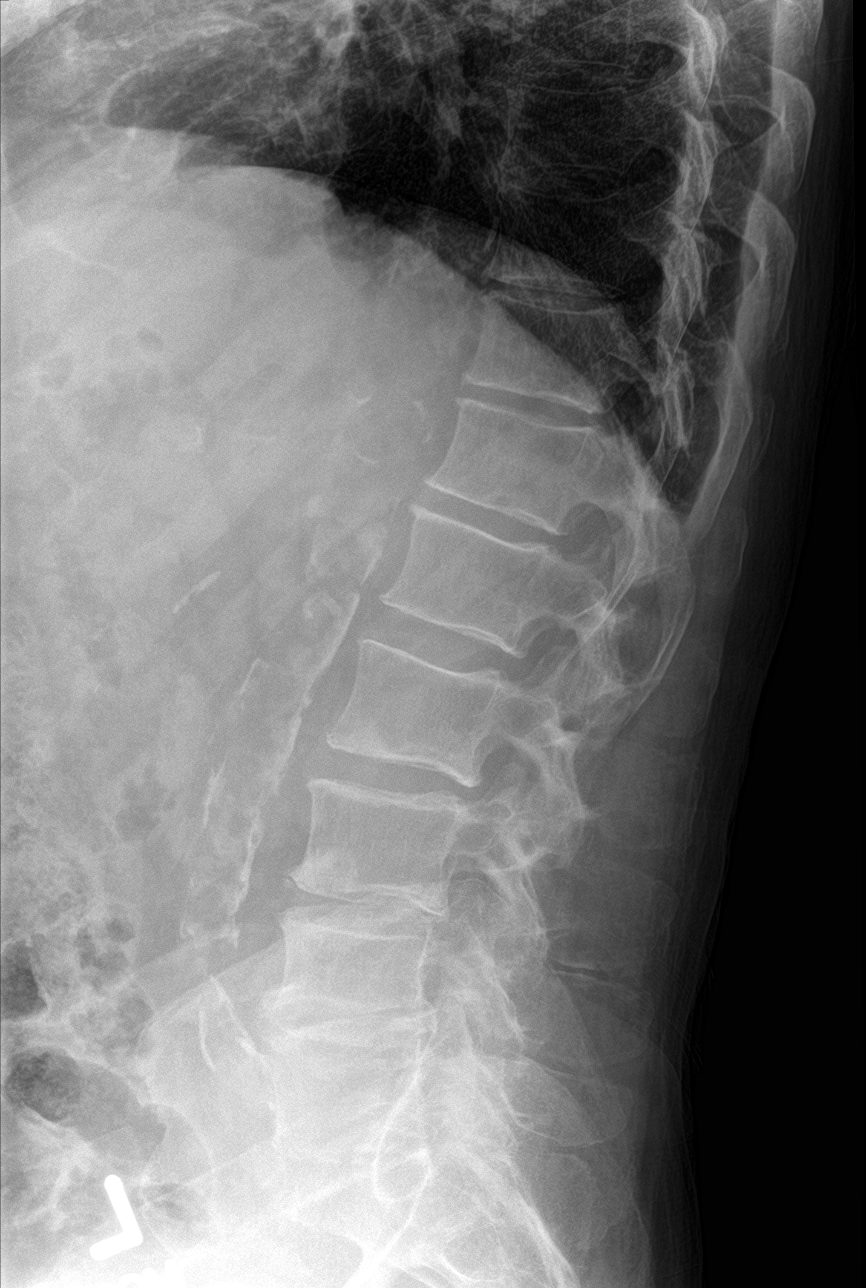

[l-spine spot]
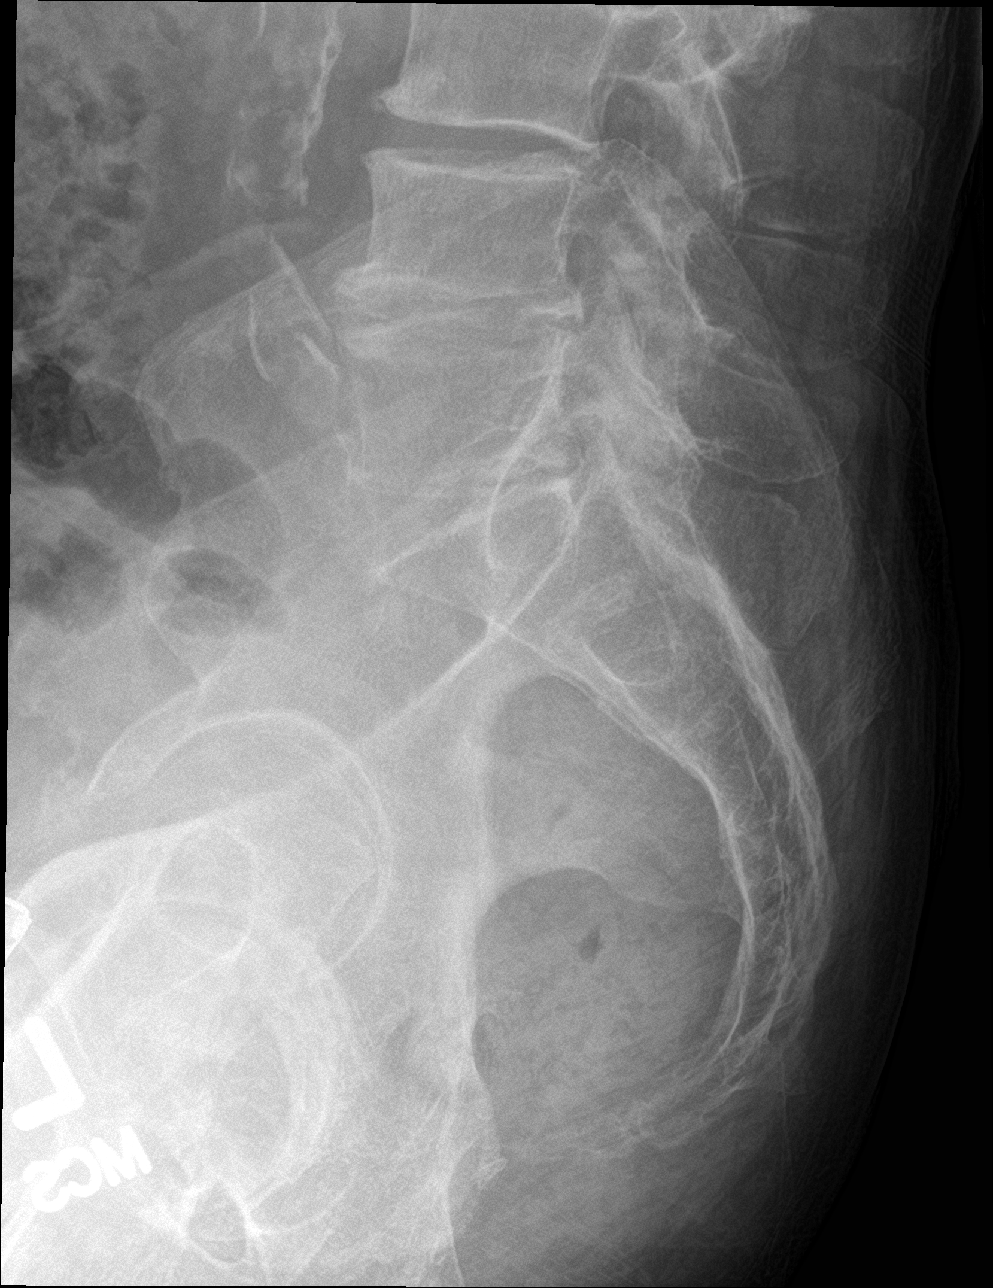

[5 of 5 positions shown; findings below may reference images not displayed]

FINDINGS: Five lumbar type vertebral segments. Vertebral body heights and
alignment are maintained. No fracture identified. Intervertebral
disc space loss with associated degenerative endplate changes, most
pronounced at L4-5 and L5-S1. Lower lumbar facet arthrosis. Severe
degenerative changes of the right hip. Abdominal aortic
atherosclerosis.
IMPRESSION: 1. No fracture or static listhesis of the lumbar spine.
2. Multilevel degenerative changes of the lumbar spine, most
pronounced at L4-5 and L5-S1.

## 2022-08-17 ENCOUNTER — Emergency Department (HOSPITAL_COMMUNITY): Payer: Medicare Other

## 2022-08-17 ENCOUNTER — Other Ambulatory Visit: Payer: Self-pay

## 2022-08-17 ENCOUNTER — Emergency Department (HOSPITAL_BASED_OUTPATIENT_CLINIC_OR_DEPARTMENT_OTHER): Payer: Medicare Other

## 2022-08-17 ENCOUNTER — Emergency Department (HOSPITAL_BASED_OUTPATIENT_CLINIC_OR_DEPARTMENT_OTHER)
Admission: EM | Admit: 2022-08-17 | Discharge: 2022-08-17 | Disposition: A | Discharge status: Home / Self Care | Payer: Medicare Other | Attending: Emergency Medicine | Admitting: Emergency Medicine

## 2022-08-17 ENCOUNTER — Ambulatory Visit
Admission: RE | Admit: 2022-08-17 | Discharge: 2022-08-17 | Disposition: A | Discharge status: Home / Self Care | Payer: Medicare Other | Source: Ambulatory Visit

## 2022-08-17 ENCOUNTER — Encounter (HOSPITAL_BASED_OUTPATIENT_CLINIC_OR_DEPARTMENT_OTHER): Payer: Self-pay

## 2022-08-17 VITALS — BP 167/74 | HR 60 | Temp 97.9°F | Resp 18 | Ht 69.0 in | Wt 190.0 lb

## 2022-08-17 DIAGNOSIS — Z8616 Personal history of COVID-19: Secondary | ICD-10-CM | POA: Diagnosis not present

## 2022-08-17 DIAGNOSIS — D333 Benign neoplasm of cranial nerves: Secondary | ICD-10-CM | POA: Diagnosis not present

## 2022-08-17 DIAGNOSIS — R234 Changes in skin texture: Secondary | ICD-10-CM | POA: Diagnosis not present

## 2022-08-17 DIAGNOSIS — I6502 Occlusion and stenosis of left vertebral artery: Secondary | ICD-10-CM | POA: Diagnosis not present

## 2022-08-17 DIAGNOSIS — R319 Hematuria, unspecified: Secondary | ICD-10-CM | POA: Diagnosis not present

## 2022-08-17 DIAGNOSIS — Z7982 Long term (current) use of aspirin: Secondary | ICD-10-CM | POA: Insufficient documentation

## 2022-08-17 DIAGNOSIS — M542 Cervicalgia: Secondary | ICD-10-CM | POA: Insufficient documentation

## 2022-08-17 DIAGNOSIS — J439 Emphysema, unspecified: Secondary | ICD-10-CM | POA: Insufficient documentation

## 2022-08-17 DIAGNOSIS — R42 Dizziness and giddiness: Secondary | ICD-10-CM | POA: Insufficient documentation

## 2022-08-17 DIAGNOSIS — G309 Alzheimer's disease, unspecified: Secondary | ICD-10-CM | POA: Diagnosis not present

## 2022-08-17 DIAGNOSIS — Z1152 Encounter for screening for COVID-19: Secondary | ICD-10-CM | POA: Diagnosis not present

## 2022-08-17 HISTORY — DX: Hypo-osmolality and hyponatremia: E87.1

## 2022-08-17 HISTORY — DX: Dementia in other diseases classified elsewhere, unspecified severity, without behavioral disturbance, psychotic disturbance, mood disturbance, and anxiety: F02.80

## 2022-08-17 HISTORY — DX: COVID-19: U07.1

## 2022-08-17 LAB — URINALYSIS, ROUTINE W REFLEX MICROSCOPIC
Bilirubin Urine: NEGATIVE
Glucose, UA: NEGATIVE mg/dL
Hgb urine dipstick: NEGATIVE
Ketones, ur: NEGATIVE mg/dL
Leukocytes,Ua: NEGATIVE
Nitrite: NEGATIVE
Protein, ur: NEGATIVE mg/dL
Specific Gravity, Urine: 1.01 (ref 1.005–1.030)
pH: 7 (ref 5.0–8.0)

## 2022-08-17 LAB — COMPREHENSIVE METABOLIC PANEL
ALT: 15 U/L (ref 0–44)
AST: 19 U/L (ref 15–41)
Albumin: 3.7 g/dL (ref 3.5–5.0)
Alkaline Phosphatase: 46 U/L (ref 38–126)
Anion gap: 5 (ref 5–15)
BUN: 15 mg/dL (ref 8–23)
CO2: 25 mmol/L (ref 22–32)
Calcium: 8.7 mg/dL — ABNORMAL LOW (ref 8.9–10.3)
Chloride: 103 mmol/L (ref 98–111)
Creatinine, Ser: 1.02 mg/dL (ref 0.61–1.24)
GFR, Estimated: >60 mL/min (ref >60)
Glucose, Bld: 144 mg/dL — ABNORMAL HIGH (ref 70–99)
Potassium: 3.9 mmol/L (ref 3.5–5.1)
Sodium: 133 mmol/L — ABNORMAL LOW (ref 135–145)
Total Bilirubin: 0.7 mg/dL (ref 0.3–1.2)
Total Protein: 6.9 g/dL (ref 6.5–8.1)

## 2022-08-17 LAB — CBC WITH DIFFERENTIAL/PLATELET
Abs Immature Granulocytes: 0.04 10*3/uL (ref 0.00–0.07)
Basophils Absolute: 0 10*3/uL (ref 0.0–0.1)
Basophils Relative: 0 %
Eosinophils Absolute: 0.1 10*3/uL (ref 0.0–0.5)
Eosinophils Relative: 1 %
HCT: 37.1 % — ABNORMAL LOW (ref 39.0–52.0)
Hemoglobin: 12.6 g/dL — ABNORMAL LOW (ref 13.0–17.0)
Immature Granulocytes: 1 %
Lymphocytes Relative: 16 %
Lymphs Abs: 1.1 10*3/uL (ref 0.7–4.0)
MCH: 31.2 pg (ref 26.0–34.0)
MCHC: 34 g/dL (ref 30.0–36.0)
MCV: 91.8 fL (ref 80.0–100.0)
Monocytes Absolute: 0.5 10*3/uL (ref 0.1–1.0)
Monocytes Relative: 8 %
Neutro Abs: 4.9 10*3/uL (ref 1.7–7.7)
Neutrophils Relative %: 74 %
Platelets: 180 10*3/uL (ref 150–400)
RBC: 4.04 MIL/uL — ABNORMAL LOW (ref 4.22–5.81)
RDW: 12 % (ref 11.5–15.5)
WBC: 6.5 10*3/uL (ref 4.0–10.5)
nRBC: 0 % (ref 0.0–0.2)

## 2022-08-17 LAB — RESP PANEL BY RT-PCR (RSV, FLU A&B, COVID)  RVPGX2
Influenza A by PCR: NEGATIVE
Influenza B by PCR: NEGATIVE
Resp Syncytial Virus by PCR: NEGATIVE
SARS Coronavirus 2 by RT PCR: NEGATIVE

## 2022-08-17 MED ORDER — SODIUM CHLORIDE 0.9 % IV BOLUS
1000.0000 mL | Freq: Once | INTRAVENOUS | Status: AC
  Administered 2022-08-17: 1000 mL via INTRAVENOUS

## 2022-08-17 MED ORDER — GADOBUTROL 1 MMOL/ML IV SOLN
8.5000 mL | Freq: Once | INTRAVENOUS | Status: AC | PRN
  Administered 2022-08-17: 8.5 mL via INTRAVENOUS

## 2022-08-17 MED ORDER — IOHEXOL 350 MG/ML SOLN
100.0000 mL | Freq: Once | INTRAVENOUS | Status: AC | PRN
  Administered 2022-08-17: 75 mL via INTRAVENOUS

## 2022-08-17 MED ORDER — DIPHENHYDRAMINE HCL 50 MG/ML IJ SOLN
12.5000 mg | Freq: Once | INTRAMUSCULAR | Status: AC
  Administered 2022-08-17: 12.5 mg via INTRAVENOUS
  Filled 2022-08-17: qty 1

## 2022-08-17 MED ORDER — MECLIZINE HCL 25 MG PO TABS
25.0000 mg | ORAL_TABLET | Freq: Once | ORAL | Status: AC
  Administered 2022-08-17: 25 mg via ORAL
  Filled 2022-08-17: qty 1

## 2022-08-17 MED ORDER — METOCLOPRAMIDE HCL 5 MG/ML IJ SOLN
5.0000 mg | Freq: Once | INTRAMUSCULAR | Status: AC
  Administered 2022-08-17: 5 mg via INTRAVENOUS
  Filled 2022-08-17: qty 2

## 2022-08-17 NOTE — ED Triage Notes (Signed)
"  Was in the hospital for covid over a month ago and had low sodium. For last 3 days he complained of occipital headache and back of neck pain. Last night he went to get up and got ver dizzy. We wanted to bring him in to be sure it was not his sodium" per daughter Daughter states she lives with him and he has not fallen. Patient is A&O x 4 but daughter said he does have alzheimer's.

## 2022-08-17 NOTE — ED Notes (Deleted)
.  uctoED

## 2022-08-17 NOTE — ED Provider Notes (Signed)
Patient is here with daughter today who states patient complains of feeling dizzy when he got up at 2 AM to go to the bathroom.  Patient states he is also had intermittent, nagging right-sided head and neck pain for the past 6 days.  EMR reviewed, patient has a history of metabolic encephalopathy secondary to hyponatremia December of last year.  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  Patient advised that given his history I recommend going to the emergency room now for stat labs to rule out repeat of hyponatremia given soft reason for SIADH in December.  Daughter states she will take him to the emergency room now for further evaluation.  Vital signs reviewed, patient does have a history of mild Alzheimer's however does seem to be mentating well at this time, patient is stable for discharge by private vehicle to the emergency room.   Lynden Oxford Scales, PA-C 08/17/22 1139

## 2022-08-17 NOTE — ED Notes (Signed)
Patient transported to MRI 

## 2022-08-17 NOTE — ED Notes (Signed)
Pt here from [facility] for MRI

## 2022-08-17 NOTE — ED Provider Notes (Signed)
[facility] [facility] Provider Note   CSN: [id number] Arrival date & time: 08/17/22  1209     History  Chief Complaint  Patient presents with   Dizziness    Allen Richardson is a 88 y.o. male.  88 yo M with a chief complaints of dizziness.  He said this started abruptly last night when he got up fast from the bed.  Since then he has had trouble ambulating.  He has been having right posterior neck pain that seems to come and go.  He denies one-sided numbness or weakness denies difficulty with speech or swallowing.  He was recently hospitalized for COVID and was found to be hyponatremic.  No known etiology for his symptoms.  Followed up with a nephrologist.  [facility] urgent care earlier today and they encouraged him to come here for lab work.   Dizziness      Home Medications Prior to Admission medications   Medication Sig Start Date End Date Taking? Authorizing Provider  albuterol (VENTOLIN HFA) 108 (90 Base) MCG/ACT inhaler Inhale into the lungs. 05/31/22   [provider]  aspirin 81 MG chewable tablet Chew by mouth.    [provider]  azithromycin (ZITHROMAX) 250 MG tablet Take 1 tablet (250 mg total) by mouth daily. Take first 2 tablets together, then 1 every day until finished. Patient not taking: Reported on 08/17/2022 05/15/22   Raylene Everts, MD  b complex vitamins capsule Take 1 capsule by mouth daily.    [provider]  Cholecalciferol 25 MCG (1000 UT) tablet Take by mouth.    [provider]  Coenzyme Q10 100 MG capsule Take by mouth.    [provider]  dextromethorphan-guaiFENesin (MUCINEX DM) 30-600 MG 12hr tablet Take 1 tablet by mouth 2 (two) times daily. Patient not taking: Reported on 08/17/2022 05/15/22   Raylene Everts, MD  Docusate Sodium (DSS) 100 MG CAPS TAKE ONE CAPSULE BY MOUTH DAILY FOR CONSTIPATION 01/13/22   [provider]  escitalopram (LEXAPRO) 5 MG  tablet Take 5 mg by mouth daily.    [provider]  famotidine (PEPCID) 20 MG tablet Take by mouth. 08/24/21   [provider]  fluticasone (FLONASE) 50 MCG/ACT nasal spray Place into the nose. 04/22/21   [provider]  lisinopril (ZESTRIL) 10 MG tablet Take 10 mg by mouth daily.    [provider]  nitroGLYCERIN (NITROSTAT) 0.4 MG SL tablet Place under the tongue. 05/10/21   [provider]  Omega-3 Fatty Acids (FISH OIL) 1000 MG CAPS Take by mouth.    [provider]  polyethylene glycol powder (GLYCOLAX/MIRALAX) 17 GM/SCOOP powder Take by mouth. 08/25/21   [provider]  senna-docusate (SENOKOT-S) 8.6-50 MG tablet Take by mouth. 06/29/14   [provider]  simvastatin (ZOCOR) 10 MG tablet Take 10 mg by mouth daily.    [provider]  tamsulosin (FLOMAX) 0.4 MG CAPS capsule Take 0.4 mg by mouth.    [provider]  vitamin B-12 (CYANOCOBALAMIN) 500 MCG tablet Take by mouth.    [provider]      Allergies    Pravastatin, Alfuzosin, Carvedilol, Cefdinir, and Penicillins    Review of Systems   Review of Systems  Neurological:  Positive for dizziness.    Physical Exam Updated Vital Signs BP (!) 159/82   Pulse 65   Temp 98 F (36.7 C) (Oral)   Resp 17   Ht '5\' 9"'$  (  1.753 m)   Wt 86.2 kg   SpO2 97%   BMI 28.06 kg/m  Physical Exam Vitals and nursing note reviewed.  Constitutional:      Appearance: He is well-developed.  HENT:     Head: Normocephalic and atraumatic.  Eyes:     Pupils: Pupils are equal, round, and reactive to light.  Neck:     Vascular: No JVD.  Cardiovascular:     Rate and Rhythm: Normal rate and regular rhythm.     Heart sounds: No murmur heard.    No friction rub. No gallop.  Pulmonary:     Effort: No respiratory distress.     Breath sounds: No wheezing.  Abdominal:     General: There is no distension.     Tenderness: There is no abdominal tenderness.  There is no guarding or rebound.  Musculoskeletal:        General: Normal range of motion.     Cervical back: Normal range of motion and neck supple.  Skin:    Coloration: Skin is not pale.     Findings: No rash.  Neurological:     Mental Status: He is alert and oriented to person, place, and time.     GCS: GCS eye subscore is 4. GCS verbal subscore is 5. GCS motor subscore is 6.     Cranial Nerves: Cranial nerves 2-12 are intact.     Sensory: Sensation is intact.     Motor: Motor function is intact.     Coordination: Coordination is intact.     Comments: The patient feels a little bit dizzy upon sitting otherwise benign neurologic exam.  No appreciable nystagmus.  No issues with coordination.  Psychiatric:        Behavior: Behavior normal.     ED Results / Procedures / Treatments   Labs (all labs ordered are listed, but only abnormal results are displayed) Labs Reviewed  CBC WITH DIFFERENTIAL/PLATELET - Abnormal; Notable for the following components:      Result Value   RBC 4.04 (*)    Hemoglobin 12.6 (*)    HCT 37.1 (*)    All other components within normal limits  COMPREHENSIVE METABOLIC PANEL - Abnormal; Notable for the following components:   Sodium 133 (*)    Glucose, Bld 144 (*)    Calcium 8.7 (*)    All other components within normal limits  URINALYSIS, ROUTINE W REFLEX MICROSCOPIC    EKG None  Radiology DG Chest Port 1 View  Result Date: 08/17/2022 CLINICAL DATA:  Dizziness EXAM: PORTABLE CHEST 1 VIEW COMPARISON:  08/11/2020 FINDINGS: 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 Both lungs are clear. The visualized skeletal structures are unremarkable. IMPRESSION: Cardiomegaly without acute abnormality of the lungs. Electronically Signed   By: Delanna Ahmadi M.D.   On: 08/17/2022 13:00    Procedures Procedures    Medications Ordered in ED Medications  sodium chloride 0.9 % bolus 1,000 mL (0 mLs Intravenous Stopped 08/17/22 1456)  metoCLOPramide  (REGLAN) injection 5 mg (5 mg Intravenous Given 08/17/22 1308)  diphenhydrAMINE (BENADRYL) injection 12.5 mg (12.5 mg Intravenous Given 08/17/22 1308)  meclizine (ANTIVERT) tablet 25 mg (25 mg Oral Given 08/17/22 1256)  iohexol (OMNIPAQUE) 350 MG/ML injection 100 mL (75 mLs Intravenous Contrast Given 08/17/22 1416)    ED Course/ Medical Decision Making/ A&P  Medical Decision Making Amount and/or Complexity of Data Reviewed Labs: ordered. Radiology: ordered.  Risk Prescription drug management.   88 yo M with a chief complaints of dizziness.  This has been going on since yesterday.  He has been having some right-sided neck pain that radiates to the head.  Will obtain a CT angiogram to assess for vertebral artery dissection.  Blood work to reassess his prior history of hyponatremia.  Bolus of IV fluids headache cocktail meclizine reassess.  Patient care signed out to Dr. Darl Householder, please see their note for further details of care in the ED.  The patients results and plan were reviewed and discussed.   Any x-rays performed were independently reviewed by myself.   Differential diagnosis were considered with the presenting HPI.  Medications  sodium chloride 0.9 % bolus 1,000 mL (0 mLs Intravenous Stopped 08/17/22 1456)  metoCLOPramide (REGLAN) injection 5 mg (5 mg Intravenous Given 08/17/22 1308)  diphenhydrAMINE (BENADRYL) injection 12.5 mg (12.5 mg Intravenous Given 08/17/22 1308)  meclizine (ANTIVERT) tablet 25 mg (25 mg Oral Given 08/17/22 1256)  iohexol (OMNIPAQUE) 350 MG/ML injection 100 mL (75 mLs Intravenous Contrast Given 08/17/22 1416)    Vitals:   08/17/22 1230 08/17/22 1231 08/17/22 1232 08/17/22 1245  BP: (!) 159/82     Pulse: 63   65  Resp:  17    Temp:  98 F (36.7 C)    TempSrc:  Oral    SpO2: 100%   97%  Weight:   86.2 kg   Height:   '5\' 9"'$  (1.753 m)     Final diagnoses:  Dizziness          Final Clinical Impression(s) / ED  Diagnoses Final diagnoses:  Dizziness    Rx / DC Orders ED Discharge Orders     None         Deno Etienne, DO 08/17/22 1508

## 2022-08-17 NOTE — ED Triage Notes (Signed)
Dizziness started this morning at 0200 when he got up to go to the bathroom Head & neck pain to R side since  Friday  Here w/ daughter

## 2022-08-17 NOTE — ED Provider Notes (Signed)
  Physical Exam  BP (!) 143/68   Pulse 62   Temp 98 F (36.7 C) (Oral)   Resp 17   Ht 5' 9"$  (1.753 m)   Wt 86.2 kg   SpO2 98%   BMI 28.06 kg/m   Physical Exam  Procedures  Procedures  ED Course / MDM    Medical Decision Making Care assumed at 3 pm.  Patient has recent COVID and hyponatremia.  Patient is here with dizziness.  Sodium level is normal.  Patient's CTA was unremarkable but CTV showed possible venous dural thrombosis.  Discussed case with Dr. Leonel Ramsay from neurology.  He states that patient will need an MRI and MRV with and without contrast and will need ED to ED transfer.  I discussed case with Dr. Armandina Gemma who will be accepting doctor   Amount and/or Complexity of Data Reviewed Labs: ordered. Decision-making details documented in ED Course. Radiology: ordered and independent interpretation performed. Decision-making details documented in ED Course.  Risk Prescription drug management.          Drenda Freeze, MD 08/17/22 [phone]

## 2022-08-17 NOTE — ED Provider Notes (Signed)
Patient signed out to me by previous provider. Please refer to their note for full HPI.  Briefly this is an 88 year old male who was transferred here from one of her outside facilities for MRI/MRV.  Patient originally presented for dizziness, had a CTV that showed a possible venous dural thrombosis.  Neurology consulted and recommended transfer here.  MRI imaging reviewed with on-call neurologist Dr. 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  Patient will be referred to ENT.  Patient and daughter educated about this diagnosis and referral to ENT.  They understand and will call ENT office tomorrow.  Patient educated on dizziness, fall risk and activities to avoid.  Daughter understands.  Patient at this time appears safe and stable for discharge and close outpatient follow up. Discharge plan and strict return to ED precautions discussed, patient verbalizes understanding and agreement.   Lorelle Gibbs, DO 08/17/22 2209

## 2022-08-17 NOTE — Discharge Instructions (Signed)
You have been seen and discharged from the emergency department.  Your MRI identified a right vestibular schwannoma, a noncancerous growth in the right auditory canal most likely the cause of your dizziness.  Call the ENT office tomorrow to establish care for evaluation and treatment.  Until this is done you need to take extreme precautions in terms of dizziness and fall risks.  You should not drive a vehicle or do any activity alone that could result in fall/injury.  Follow-up with your primary provider for further evaluation and further care. Take home medications as prescribed. If you have any worsening symptoms or further concerns for your health please return to an emergency department for further evaluation.

## 2022-08-17 NOTE — ED Notes (Signed)
Patient is being discharged from the Urgent Care and sent to the Emergency Department via POV. Per L.Lilia Pro, [place], patient is in need of higher level of care due to medical history. Patient is aware and verbalizes understanding of plan of care.  Vitals:   08/17/22 1117  BP: (!) 167/74  Pulse: 60  Resp: 18  Temp: 97.9 F (36.6 C)  SpO2: 98%   Pt to [facility]
